# Patient Record
Sex: Female | Born: 1970 | Race: White | Hispanic: No | Marital: Married | Smoking: Never smoker
Health system: Southern US, Community
[De-identification: ages and names within clinical notes are randomized; demographics above are authoritative.]

## PROBLEM LIST (undated history)

## (undated) DIAGNOSIS — E039 Hypothyroidism, unspecified: Secondary | ICD-10-CM

## (undated) DIAGNOSIS — F909 Attention-deficit hyperactivity disorder, unspecified type: Secondary | ICD-10-CM

## (undated) DIAGNOSIS — E063 Autoimmune thyroiditis: Secondary | ICD-10-CM

## (undated) DIAGNOSIS — F32A Depression, unspecified: Secondary | ICD-10-CM

## (undated) DIAGNOSIS — D649 Anemia, unspecified: Secondary | ICD-10-CM

## (undated) DIAGNOSIS — Z9889 Other specified postprocedural states: Secondary | ICD-10-CM

## (undated) DIAGNOSIS — R112 Nausea with vomiting, unspecified: Secondary | ICD-10-CM

## (undated) DIAGNOSIS — F419 Anxiety disorder, unspecified: Secondary | ICD-10-CM

## (undated) DIAGNOSIS — T8859XA Other complications of anesthesia, initial encounter: Secondary | ICD-10-CM

## (undated) DIAGNOSIS — R519 Headache, unspecified: Secondary | ICD-10-CM

## (undated) HISTORY — PX: COLONOSCOPY: SHX174

---

## 2012-06-14 DIAGNOSIS — E039 Hypothyroidism, unspecified: Secondary | ICD-10-CM | POA: Insufficient documentation

## 2012-06-14 DIAGNOSIS — E063 Autoimmune thyroiditis: Secondary | ICD-10-CM | POA: Insufficient documentation

## 2015-07-19 DIAGNOSIS — G43909 Migraine, unspecified, not intractable, without status migrainosus: Secondary | ICD-10-CM | POA: Insufficient documentation

## 2015-09-06 DIAGNOSIS — B070 Plantar wart: Secondary | ICD-10-CM | POA: Insufficient documentation

## 2020-08-15 DIAGNOSIS — F428 Other obsessive-compulsive disorder: Secondary | ICD-10-CM | POA: Diagnosis not present

## 2020-08-20 DIAGNOSIS — F4323 Adjustment disorder with mixed anxiety and depressed mood: Secondary | ICD-10-CM | POA: Diagnosis not present

## 2020-08-20 DIAGNOSIS — F331 Major depressive disorder, recurrent, moderate: Secondary | ICD-10-CM | POA: Diagnosis not present

## 2020-08-20 DIAGNOSIS — F900 Attention-deficit hyperactivity disorder, predominantly inattentive type: Secondary | ICD-10-CM | POA: Diagnosis not present

## 2020-09-12 DIAGNOSIS — F428 Other obsessive-compulsive disorder: Secondary | ICD-10-CM | POA: Diagnosis not present

## 2020-10-10 DIAGNOSIS — F428 Other obsessive-compulsive disorder: Secondary | ICD-10-CM | POA: Diagnosis not present

## 2020-11-07 DIAGNOSIS — F428 Other obsessive-compulsive disorder: Secondary | ICD-10-CM | POA: Diagnosis not present

## 2020-11-12 DIAGNOSIS — F419 Anxiety disorder, unspecified: Secondary | ICD-10-CM | POA: Diagnosis not present

## 2020-11-12 DIAGNOSIS — F900 Attention-deficit hyperactivity disorder, predominantly inattentive type: Secondary | ICD-10-CM | POA: Diagnosis not present

## 2020-11-21 DIAGNOSIS — F428 Other obsessive-compulsive disorder: Secondary | ICD-10-CM | POA: Diagnosis not present

## 2020-11-28 DIAGNOSIS — L820 Inflamed seborrheic keratosis: Secondary | ICD-10-CM | POA: Diagnosis not present

## 2020-11-28 DIAGNOSIS — L501 Idiopathic urticaria: Secondary | ICD-10-CM | POA: Diagnosis not present

## 2020-12-05 DIAGNOSIS — F428 Other obsessive-compulsive disorder: Secondary | ICD-10-CM | POA: Diagnosis not present

## 2020-12-26 DIAGNOSIS — H5213 Myopia, bilateral: Secondary | ICD-10-CM | POA: Diagnosis not present

## 2020-12-26 DIAGNOSIS — F428 Other obsessive-compulsive disorder: Secondary | ICD-10-CM | POA: Diagnosis not present

## 2021-01-07 DIAGNOSIS — Z113 Encounter for screening for infections with a predominantly sexual mode of transmission: Secondary | ICD-10-CM | POA: Diagnosis not present

## 2021-01-16 DIAGNOSIS — F428 Other obsessive-compulsive disorder: Secondary | ICD-10-CM | POA: Diagnosis not present

## 2021-01-27 DIAGNOSIS — Z20828 Contact with and (suspected) exposure to other viral communicable diseases: Secondary | ICD-10-CM | POA: Diagnosis not present

## 2021-02-04 DIAGNOSIS — F900 Attention-deficit hyperactivity disorder, predominantly inattentive type: Secondary | ICD-10-CM | POA: Diagnosis not present

## 2021-02-04 DIAGNOSIS — F331 Major depressive disorder, recurrent, moderate: Secondary | ICD-10-CM | POA: Diagnosis not present

## 2021-02-06 DIAGNOSIS — F428 Other obsessive-compulsive disorder: Secondary | ICD-10-CM | POA: Diagnosis not present

## 2021-02-20 DIAGNOSIS — F428 Other obsessive-compulsive disorder: Secondary | ICD-10-CM | POA: Diagnosis not present

## 2021-03-06 DIAGNOSIS — F428 Other obsessive-compulsive disorder: Secondary | ICD-10-CM | POA: Diagnosis not present

## 2021-03-20 DIAGNOSIS — F428 Other obsessive-compulsive disorder: Secondary | ICD-10-CM | POA: Diagnosis not present

## 2021-04-03 DIAGNOSIS — F428 Other obsessive-compulsive disorder: Secondary | ICD-10-CM | POA: Diagnosis not present

## 2021-04-17 DIAGNOSIS — F428 Other obsessive-compulsive disorder: Secondary | ICD-10-CM | POA: Diagnosis not present

## 2021-05-02 DIAGNOSIS — F900 Attention-deficit hyperactivity disorder, predominantly inattentive type: Secondary | ICD-10-CM | POA: Diagnosis not present

## 2021-05-02 DIAGNOSIS — F411 Generalized anxiety disorder: Secondary | ICD-10-CM | POA: Diagnosis not present

## 2021-05-02 DIAGNOSIS — F4322 Adjustment disorder with anxiety: Secondary | ICD-10-CM | POA: Diagnosis not present

## 2021-05-15 DIAGNOSIS — F428 Other obsessive-compulsive disorder: Secondary | ICD-10-CM | POA: Diagnosis not present

## 2021-06-02 DIAGNOSIS — F428 Other obsessive-compulsive disorder: Secondary | ICD-10-CM | POA: Diagnosis not present

## 2021-06-17 DIAGNOSIS — E039 Hypothyroidism, unspecified: Secondary | ICD-10-CM | POA: Diagnosis not present

## 2021-06-18 DIAGNOSIS — N959 Unspecified menopausal and perimenopausal disorder: Secondary | ICD-10-CM | POA: Diagnosis not present

## 2021-06-18 DIAGNOSIS — E559 Vitamin D deficiency, unspecified: Secondary | ICD-10-CM | POA: Diagnosis not present

## 2021-06-18 DIAGNOSIS — R5383 Other fatigue: Secondary | ICD-10-CM | POA: Diagnosis not present

## 2021-06-19 DIAGNOSIS — F428 Other obsessive-compulsive disorder: Secondary | ICD-10-CM | POA: Diagnosis not present

## 2021-07-03 DIAGNOSIS — F428 Other obsessive-compulsive disorder: Secondary | ICD-10-CM | POA: Diagnosis not present

## 2021-07-18 DIAGNOSIS — F428 Other obsessive-compulsive disorder: Secondary | ICD-10-CM | POA: Diagnosis not present

## 2021-07-25 DIAGNOSIS — F331 Major depressive disorder, recurrent, moderate: Secondary | ICD-10-CM | POA: Diagnosis not present

## 2021-07-25 DIAGNOSIS — F900 Attention-deficit hyperactivity disorder, predominantly inattentive type: Secondary | ICD-10-CM | POA: Diagnosis not present

## 2021-07-25 DIAGNOSIS — F411 Generalized anxiety disorder: Secondary | ICD-10-CM | POA: Diagnosis not present

## 2021-08-08 DIAGNOSIS — F428 Other obsessive-compulsive disorder: Secondary | ICD-10-CM | POA: Diagnosis not present

## 2021-08-13 DIAGNOSIS — N959 Unspecified menopausal and perimenopausal disorder: Secondary | ICD-10-CM | POA: Diagnosis not present

## 2021-08-13 DIAGNOSIS — R41840 Attention and concentration deficit: Secondary | ICD-10-CM | POA: Diagnosis not present

## 2021-08-21 ENCOUNTER — Other Ambulatory Visit: Payer: Self-pay | Admitting: Family Medicine

## 2021-08-21 DIAGNOSIS — Z1231 Encounter for screening mammogram for malignant neoplasm of breast: Secondary | ICD-10-CM

## 2021-09-05 DIAGNOSIS — F428 Other obsessive-compulsive disorder: Secondary | ICD-10-CM | POA: Diagnosis not present

## 2021-09-11 DIAGNOSIS — D509 Iron deficiency anemia, unspecified: Secondary | ICD-10-CM | POA: Diagnosis not present

## 2021-09-11 DIAGNOSIS — Z1211 Encounter for screening for malignant neoplasm of colon: Secondary | ICD-10-CM | POA: Diagnosis not present

## 2021-09-11 DIAGNOSIS — F908 Attention-deficit hyperactivity disorder, other type: Secondary | ICD-10-CM | POA: Diagnosis not present

## 2021-09-18 ENCOUNTER — Ambulatory Visit
Admission: RE | Admit: 2021-09-18 | Discharge: 2021-09-18 | Disposition: A | Discharge status: Home / Self Care | Payer: BC Managed Care – PPO | Source: Ambulatory Visit | Attending: Family Medicine | Admitting: Family Medicine

## 2021-09-18 DIAGNOSIS — Z1231 Encounter for screening mammogram for malignant neoplasm of breast: Secondary | ICD-10-CM | POA: Diagnosis not present

## 2021-10-02 DIAGNOSIS — E039 Hypothyroidism, unspecified: Secondary | ICD-10-CM | POA: Diagnosis not present

## 2021-10-14 DIAGNOSIS — F428 Other obsessive-compulsive disorder: Secondary | ICD-10-CM | POA: Diagnosis not present

## 2021-10-22 DIAGNOSIS — F331 Major depressive disorder, recurrent, moderate: Secondary | ICD-10-CM | POA: Diagnosis not present

## 2021-10-22 DIAGNOSIS — F900 Attention-deficit hyperactivity disorder, predominantly inattentive type: Secondary | ICD-10-CM | POA: Diagnosis not present

## 2021-11-28 DIAGNOSIS — R5383 Other fatigue: Secondary | ICD-10-CM | POA: Diagnosis not present

## 2021-11-28 DIAGNOSIS — N926 Irregular menstruation, unspecified: Secondary | ICD-10-CM | POA: Diagnosis not present

## 2021-11-28 DIAGNOSIS — Z113 Encounter for screening for infections with a predominantly sexual mode of transmission: Secondary | ICD-10-CM | POA: Diagnosis not present

## 2021-11-28 DIAGNOSIS — E611 Iron deficiency: Secondary | ICD-10-CM | POA: Diagnosis not present

## 2021-12-03 DIAGNOSIS — F428 Other obsessive-compulsive disorder: Secondary | ICD-10-CM | POA: Diagnosis not present

## 2021-12-04 DIAGNOSIS — Z01411 Encounter for gynecological examination (general) (routine) with abnormal findings: Secondary | ICD-10-CM | POA: Diagnosis not present

## 2021-12-04 DIAGNOSIS — L723 Sebaceous cyst: Secondary | ICD-10-CM | POA: Diagnosis not present

## 2021-12-04 DIAGNOSIS — Z124 Encounter for screening for malignant neoplasm of cervix: Secondary | ICD-10-CM | POA: Diagnosis not present

## 2021-12-17 DIAGNOSIS — F428 Other obsessive-compulsive disorder: Secondary | ICD-10-CM | POA: Diagnosis not present

## 2022-01-07 DIAGNOSIS — F428 Other obsessive-compulsive disorder: Secondary | ICD-10-CM | POA: Diagnosis not present

## 2022-01-13 DIAGNOSIS — N939 Abnormal uterine and vaginal bleeding, unspecified: Secondary | ICD-10-CM | POA: Diagnosis not present

## 2022-01-13 DIAGNOSIS — N951 Menopausal and female climacteric states: Secondary | ICD-10-CM | POA: Diagnosis not present

## 2022-01-13 DIAGNOSIS — N83202 Unspecified ovarian cyst, left side: Secondary | ICD-10-CM | POA: Diagnosis not present

## 2022-01-14 DIAGNOSIS — F900 Attention-deficit hyperactivity disorder, predominantly inattentive type: Secondary | ICD-10-CM | POA: Diagnosis not present

## 2022-01-15 DIAGNOSIS — F428 Other obsessive-compulsive disorder: Secondary | ICD-10-CM | POA: Diagnosis not present

## 2022-01-19 DIAGNOSIS — D509 Iron deficiency anemia, unspecified: Secondary | ICD-10-CM | POA: Diagnosis not present

## 2022-01-19 DIAGNOSIS — Z1211 Encounter for screening for malignant neoplasm of colon: Secondary | ICD-10-CM | POA: Diagnosis not present

## 2022-01-21 DIAGNOSIS — F428 Other obsessive-compulsive disorder: Secondary | ICD-10-CM | POA: Diagnosis not present

## 2022-01-29 DIAGNOSIS — F428 Other obsessive-compulsive disorder: Secondary | ICD-10-CM | POA: Diagnosis not present

## 2022-02-05 DIAGNOSIS — F428 Other obsessive-compulsive disorder: Secondary | ICD-10-CM | POA: Diagnosis not present

## 2022-02-13 DIAGNOSIS — R42 Dizziness and giddiness: Secondary | ICD-10-CM

## 2022-02-13 HISTORY — DX: Dizziness and giddiness: R42

## 2022-02-20 DIAGNOSIS — F428 Other obsessive-compulsive disorder: Secondary | ICD-10-CM | POA: Diagnosis not present

## 2022-03-06 DIAGNOSIS — F428 Other obsessive-compulsive disorder: Secondary | ICD-10-CM | POA: Diagnosis not present

## 2022-03-17 ENCOUNTER — Encounter (HOSPITAL_BASED_OUTPATIENT_CLINIC_OR_DEPARTMENT_OTHER): Payer: Self-pay | Admitting: Emergency Medicine

## 2022-03-17 ENCOUNTER — Other Ambulatory Visit: Payer: Self-pay

## 2022-03-17 ENCOUNTER — Emergency Department (HOSPITAL_BASED_OUTPATIENT_CLINIC_OR_DEPARTMENT_OTHER)
Admission: EM | Admit: 2022-03-17 | Discharge: 2022-03-17 | Disposition: A | Discharge status: Home / Self Care | Payer: BC Managed Care – PPO | Attending: Emergency Medicine | Admitting: Emergency Medicine

## 2022-03-17 DIAGNOSIS — R112 Nausea with vomiting, unspecified: Secondary | ICD-10-CM | POA: Insufficient documentation

## 2022-03-17 DIAGNOSIS — R42 Dizziness and giddiness: Secondary | ICD-10-CM | POA: Diagnosis not present

## 2022-03-17 DIAGNOSIS — R519 Headache, unspecified: Secondary | ICD-10-CM | POA: Diagnosis not present

## 2022-03-17 HISTORY — DX: Autoimmune thyroiditis: E06.3

## 2022-03-17 HISTORY — DX: Attention-deficit hyperactivity disorder, unspecified type: F90.9

## 2022-03-17 LAB — BASIC METABOLIC PANEL
Anion gap: 7 (ref 5–15)
BUN: 17 mg/dL (ref 6–20)
CO2: 27 mmol/L (ref 22–32)
Calcium: 9.4 mg/dL (ref 8.9–10.3)
Chloride: 103 mmol/L (ref 98–111)
Creatinine, Ser: 0.8 mg/dL (ref 0.44–1.00)
GFR, Estimated: >60 mL/min (ref >60)
Glucose, Bld: 100 mg/dL — ABNORMAL HIGH (ref 70–99)
Potassium: 3.9 mmol/L (ref 3.5–5.1)
Sodium: 137 mmol/L (ref 135–145)

## 2022-03-17 LAB — CBC WITH DIFFERENTIAL/PLATELET
Abs Immature Granulocytes: 0.01 10*3/uL (ref 0.00–0.07)
Basophils Absolute: 0 10*3/uL (ref 0.0–0.1)
Basophils Relative: 0 %
Eosinophils Absolute: 0 10*3/uL (ref 0.0–0.5)
Eosinophils Relative: 0 %
HCT: 40.7 % (ref 36.0–46.0)
Hemoglobin: 13.7 g/dL (ref 12.0–15.0)
Immature Granulocytes: 0 %
Lymphocytes Relative: 16 %
Lymphs Abs: 1.1 10*3/uL (ref 0.7–4.0)
MCH: 29.7 pg (ref 26.0–34.0)
MCHC: 33.7 g/dL (ref 30.0–36.0)
MCV: 88.3 fL (ref 80.0–100.0)
Monocytes Absolute: 0.3 10*3/uL (ref 0.1–1.0)
Monocytes Relative: 5 %
Neutro Abs: 5.6 10*3/uL (ref 1.7–7.7)
Neutrophils Relative %: 79 %
Platelets: 199 10*3/uL (ref 150–400)
RBC: 4.61 MIL/uL (ref 3.87–5.11)
RDW: 12.5 % (ref 11.5–15.5)
WBC: 7.1 10*3/uL (ref 4.0–10.5)
nRBC: 0 % (ref 0.0–0.2)

## 2022-03-17 MED ORDER — MECLIZINE HCL 25 MG PO TABS: 25.0000 mg | ORAL_TABLET | Freq: Three times a day (TID) | ORAL | 0 refills | Status: DC | PRN

## 2022-03-17 MED ORDER — MECLIZINE HCL 25 MG PO TABS
25.0000 mg | ORAL_TABLET | Freq: Once | ORAL | Status: AC
  Administered 2022-03-17: 25 mg via ORAL
  Filled 2022-03-17: qty 1

## 2022-03-17 MED ORDER — ONDANSETRON HCL 4 MG PO TABS: 4.0000 mg | ORAL_TABLET | Freq: Four times a day (QID) | ORAL | 0 refills | Status: DC

## 2022-03-17 MED ORDER — ONDANSETRON HCL 4 MG/2ML IJ SOLN
4.0000 mg | Freq: Once | INTRAMUSCULAR | Status: AC
  Administered 2022-03-17: 4 mg via INTRAVENOUS
  Filled 2022-03-17: qty 2

## 2022-03-17 MED ORDER — SODIUM CHLORIDE 0.9 % IV BOLUS
1000.0000 mL | Freq: Once | INTRAVENOUS | Status: AC
  Administered 2022-03-17: 1000 mL via INTRAVENOUS

## 2022-03-17 NOTE — ED Provider Notes (Signed)
[facility] EMERGENCY DEPT Provider Note   CSN: [id number] Arrival date & time: 03/17/22  1805     History  No chief complaint on file.   Laura Spencer is a 51 y.o. female.  Patient is a 51 year old female who presents with vertigo.  She said she woke up this morning with a spinning sensation and dizziness.  She has associated nausea and vomiting.  She has a very mild headache.  No neck pain.  No fevers.  No numbness or weakness to her extremities.  No vision changes or blind spots.  No speech deficits.  No history of similar symptoms in the past.  She went to [facility] health center at [facility] [employer] [facility] [employer] where she goes to school.  She was sent here for further evaluation.  She is feeling a little bit better than she did earlier today.       Home Medications Prior to Admission medications   Medication Sig Start Date End Date Taking? Authorizing Provider  meclizine (ANTIVERT) 25 MG tablet Take 1 tablet (25 mg total) by mouth 3 (three) times daily as needed for dizziness. 03/17/22  Yes Malvin Johns, MD  ondansetron (ZOFRAN) 4 MG tablet Take 1 tablet (4 mg total) by mouth every 6 (six) hours. 03/17/22  Yes Malvin Johns, MD      Allergies    Patient has no allergy information on record.    Review of Systems   Review of Systems  Constitutional:  Negative for chills, diaphoresis, fatigue and fever.  HENT:  Negative for congestion, rhinorrhea and sneezing.   Eyes: Negative.   Respiratory:  Negative for cough, chest tightness and shortness of breath.   Cardiovascular:  Negative for chest pain and leg swelling.  Gastrointestinal:  Positive for nausea and vomiting. Negative for abdominal pain, blood in stool and diarrhea.  Genitourinary:  Negative for difficulty urinating, flank pain, frequency and hematuria.  Musculoskeletal:  Negative for arthralgias and back pain.  Skin:  Negative for rash.  Neurological:  Positive for dizziness and headaches. Negative for speech difficulty,  weakness and numbness.    Physical Exam Updated Vital Signs BP 112/70   Pulse 60   Temp 98 F (36.7 C) (Oral)   Resp 17   Ht '5\' 6"'$  (1.676 m)   Wt 74.8 kg   SpO2 100%   BMI 26.63 kg/m  Physical Exam Constitutional:      Appearance: She is well-developed.  HENT:     Head: Normocephalic and atraumatic.  Eyes:     Pupils: Pupils are equal, round, and reactive to light.  Cardiovascular:     Rate and Rhythm: Normal rate and regular rhythm.     Heart sounds: Normal heart sounds.  Pulmonary:     Effort: Pulmonary effort is normal. No respiratory distress.     Breath sounds: Normal breath sounds. No wheezing or rales.  Chest:     Chest wall: No tenderness.  Abdominal:     General: Bowel sounds are normal.     Palpations: Abdomen is soft.     Tenderness: There is no abdominal tenderness. There is no guarding or rebound.  Musculoskeletal:        General: Normal range of motion.     Cervical back: Normal range of motion and neck supple.  Lymphadenopathy:     Cervical: No cervical adenopathy.  Skin:    General: Skin is warm and dry.     Findings: No rash.  Neurological:     Mental Status: She is alert  and oriented to person, place, and time.     Comments: Motor 5/5 all extremities Sensation grossly intact to LT all extremities Finger to Nose intact, no pronator drift CN II-XII grossly intact       ED Results / Procedures / Treatments   Labs (all labs ordered are listed, but only abnormal results are displayed) Labs Reviewed  BASIC METABOLIC PANEL - Abnormal; Notable for the following components:      Result Value   Glucose, Bld 100 (*)    All other components within normal limits  CBC WITH DIFFERENTIAL/PLATELET    EKG None  Radiology No results found.  Procedures Procedures    Medications Ordered in ED Medications  sodium chloride 0.9 % bolus 1,000 mL (0 mLs Intravenous Stopped 03/17/22 2235)  ondansetron (ZOFRAN) injection 4 mg (4 mg Intravenous Given  03/17/22 2146)  meclizine (ANTIVERT) tablet 25 mg (25 mg Oral Given 03/17/22 2146)    ED Course/ Medical Decision Making/ A&P                           Medical Decision Making Problems Addressed: Vertigo: acute illness or injury with systemic symptoms  Amount and/or Complexity of Data Reviewed Labs: ordered. Decision-making details documented in ED Course.  Risk Prescription drug management. Decision regarding hospitalization.   Patient is a 51 year old who presents with dizziness.  She does not have any focal neurologic deficits.  Her hints exam is consistent with peripheral etiology for the vertigo.  No other symptoms that are more concerning for central etiology/stroke.  Her labs are reviewed and are nonconcerning.  She was given IV fluids and meclizine as well as Zofran.  She is feeling much better.  Her dizziness is much improved.  She is able to ambulate without ataxia.  She was discharged home in good condition.  She was encouraged to follow-up with her primary care doctor.  Return precautions were given.  She was given prescriptions for meclizine and Zofran.  Final Clinical Impression(s) / ED Diagnoses Final diagnoses:  Vertigo    Rx / DC Orders ED Discharge Orders          Ordered    meclizine (ANTIVERT) 25 MG tablet  3 times daily PRN        03/17/22 2308    ondansetron (ZOFRAN) 4 MG tablet  Every 6 hours        03/17/22 2308              Malvin Johns, MD 03/17/22 2311

## 2022-03-17 NOTE — ED Notes (Signed)
Patient reports feeling much better after treatment. States "its like night and day from earlier" Able to ambulate in the room without symptoms

## 2022-03-17 NOTE — ED Notes (Signed)
Reviewed AVS/discharge instruction with patient. Time allotted for and all questions answered. Patient is agreeable for d/c and escorted to ed exit by staff.  

## 2022-03-17 NOTE — ED Triage Notes (Signed)
Pt arrived POV. Pt caox4. Pt reports she woke up this morning "with vertigo." Pt c/o dizziness and N/V. Pt reports multiple episodes of vomiting this morning. Pt denies pain. Pt took Zofran approx 530 this evening.

## 2022-03-20 DIAGNOSIS — F428 Other obsessive-compulsive disorder: Secondary | ICD-10-CM | POA: Diagnosis not present

## 2022-03-28 DIAGNOSIS — U071 COVID-19: Secondary | ICD-10-CM | POA: Diagnosis not present

## 2022-04-03 DIAGNOSIS — F428 Other obsessive-compulsive disorder: Secondary | ICD-10-CM | POA: Diagnosis not present

## 2022-04-03 DIAGNOSIS — F4322 Adjustment disorder with anxiety: Secondary | ICD-10-CM | POA: Diagnosis not present

## 2022-04-10 DIAGNOSIS — F900 Attention-deficit hyperactivity disorder, predominantly inattentive type: Secondary | ICD-10-CM | POA: Diagnosis not present

## 2022-04-10 DIAGNOSIS — F411 Generalized anxiety disorder: Secondary | ICD-10-CM | POA: Diagnosis not present

## 2022-04-10 DIAGNOSIS — F331 Major depressive disorder, recurrent, moderate: Secondary | ICD-10-CM | POA: Diagnosis not present

## 2022-04-10 DIAGNOSIS — F4323 Adjustment disorder with mixed anxiety and depressed mood: Secondary | ICD-10-CM | POA: Diagnosis not present

## 2022-04-16 DIAGNOSIS — N858 Other specified noninflammatory disorders of uterus: Secondary | ICD-10-CM | POA: Diagnosis not present

## 2022-04-16 DIAGNOSIS — Z6828 Body mass index (BMI) 28.0-28.9, adult: Secondary | ICD-10-CM | POA: Diagnosis not present

## 2022-04-17 DIAGNOSIS — F413 Other mixed anxiety disorders: Secondary | ICD-10-CM | POA: Diagnosis not present

## 2022-04-24 ENCOUNTER — Telehealth: Payer: Self-pay

## 2022-04-24 NOTE — Telephone Encounter (Signed)
Left message for patient to call back and schedule new patient appointment on 11/13 '@9'$ :45am with Dr Ernestina Patches.

## 2022-04-24 NOTE — Telephone Encounter (Signed)
Spoke with the patient regarding the referral to GYN oncology. Patient scheduled as new patient with Dr Ernestina Patches on 04/27/2022. Patient given an arrival time of 9:15am.  Explained to the patient the the doctor will perform a pelvic exam at this visit. Patient given the policy that no visitors under the 16 yrs are allowed in the [facility]. Patient given the address/phone number for the clinic and that the center offers free valet service.

## 2022-04-26 NOTE — Progress Notes (Unsigned)
GYNECOLOGIC ONCOLOGY NEW PATIENT CONSULTATION  Date of Service: 04/27/2022 Referring Provider: Irene Pap, MD   ASSESSMENT AND PLAN: Laura Spencer is a 51 y.o. 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  We reviewed that the exact etiology of the pelvic mass is unclear, but could include a benign, borderline, or malignant process.  The recommended treatment is surgical excision to make a definitive diagnosis. She has normal tumor markers which is reassuring but not definitive. We also discussed that her irregular bleeding is likely normal perimenopausal changes. 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 See procedure note below.  Because the mass is relatively small, we feel that a minimally invasive approach is feasible, using robotic assistance. We discussed a salpingectomy, possible unilateral salpingo-oophorectomy. Discussed that often if a complex mass in the adnexa, would remove both together as not to disrupt the mass and remove intact.   In the event of malignancy or borderline tumor on frozen section, we will perform indicated staging procedures. We discussed that these procedures may include omentectomy pelvic and/or para-aortic lymphadenectomy, peritoneal biopsies. We would also remove any tissue concerning for metastatic disease which could require additional procedures including bowel surgery. Patient wishes to forgo a hysterectomy if benign disease. If malignant or borderline, she is in agreement with recommendation for hysterectomy. Otherwise, if biopsy abnormal, will discuss possible hysterectomy at time of adnexal surgery if indicated.   Patient was consented for: Robotic assisted unilateral salpingectomy versus unilateral  salpingo-oophorectomy, possible bilateral salpingo-oophorectomy, hysterectomy, staging procedures (omentectomy, pelvic/para-aortic lymph node dissection, biopsies) on 06/23/22.  Patient wishes to wait until January given some upcoming personal plans. Discussed CT abdomen/pelvis prior to surgery and if any additional concerns for malignancy based on imaging, we discussed that I may recommend surgery sooner.  The risks of surgery were discussed in detail and she understands these to including but not limited to bleeding requiring a blood transfusion, infection, injury to adjacent organs (including but not limited to the bowels, bladder, ureters, nerves, blood vessels), thromboembolic events, wound separation, hernia, vaginal cuff separation, possible risk of lymphedema and lymphocyst if lymphadenectomy performed, unforseen complication, and possible need for re-exploration.  If the patient experiences any of these events, she understands that her hospitalization or recovery may be prolonged and that she may need to take additional medications for a prolonged period. The patient will receive DVT and antibiotic prophylaxis as indicated. She voiced a clear understanding. She had the opportunity to ask questions and written informed consent was obtained today. She wishes to proceed.  She does not require preoperative clearance. Her METs are >4.  All preoperative instructions were reviewed. Postoperative expectations were also reviewed.   A copy of this note was sent to the patient's referring provider.  Bernadene Bell, MD Gynecologic Oncology   Medical Decision Making I personally spent  TOTAL 53 minutes face-to-face and non-face-to-face in the care of this patient, which includes all pre, intra, and post visit time on the date of service.  5 minutes spent reviewing records prior to the visit 35 Minutes in patient contact      5 minutes in other billable services 8 minutes charting , conferring with  consultants etc.   ------------  CC: adnexal mass  HISTORY OF PRESENT ILLNESS:  Laura Spencer is a 51 y.o. woman who is seen in consultation at  the request of Irene Pap, MD for evaluation of complex pelvic mass.  Patient previously underwent an ultrasound on 01/13/2022 for abnormal uterine bleeding and IUD with strings not visualized.  This demonstrated an IUD in place, endometrial stripe of 5.4 mm and 2 cyst with irregular borders of the left adnexa measuring 2.9 cm and 2.3 cm.  Patient then underwent a repeat pelvic ultrasound on 04/16/2022 which demonstrated an endometrial stripe of 8.2 mm, IUD in place at the uterine fundus, and irregular cystic areas in the left adnexa which appears to be fluid-filled left fallopian tube.  She underwent tumor markers which were within normal limits with a CEA of 0.6, CA 19-9 of 13, and a CA125 of 17.5.  At the time of this visit, patient reported resolution of her irregular bleeding with management of her thyroid.  She noted a regular period on 03/19/2022.  She has a copper IUD in place.  She had reported irregular menses x1 year.  Today patient presents with her fianc.  She reports heavy periods last year and lighter but irregular periods over the course of this year.  She believes her IUD is a copper IUD, in place since March 2017.  She reports that she has not had an endometrial biopsy before.  Her bleeding is currently spotting to regular volume and sporadic; however, her bleeding this past months she felt was more on time with a regular period.  She otherwise denies abdominal bloating, early satiety, significant weight loss, change in bowel or bladder habits.   PAST MEDICAL HISTORY: Past Medical History:  Diagnosis Date   Attention deficit hyperactivity disorder    Hashimoto's disease     PAST SURGICAL HISTORY: Past Surgical History:  Procedure Laterality Date   CESAREAN SECTION     x2    OB/GYN HISTORY: OB History  Gravida Para  Term Preterm AB Living  '2 2 2     2  '$ SAB IAB Ectopic Multiple Live Births          2    # Outcome Date GA Lbr Len/2nd Weight Sex Delivery Anes PTL Lv  2 Term      CS-Unspec   LIV  1 Term      CS-Unspec   LIV      Age at menarche: 76 Age at menopause: N/A Hx of HRT: N/A Hx of STI: No Last pap: 12/04/2021 NILM, HPV negative History of abnormal pap smears: No  SCREENING STUDIES:  Last mammogram: 11/2021 Last colonoscopy: 01/19/2022, 10-year follow-up  MEDICATIONS:  Current Outpatient Medications:    methylphenidate 27 MG PO CR tablet, Take 27 mg by mouth daily., Disp: , Rfl:    ondansetron (ZOFRAN-ODT) 4 MG disintegrating tablet, Take 4 mg by mouth every 8 (eight) hours as needed., Disp: , Rfl:    meclizine (ANTIVERT) 25 MG tablet, Take 1 tablet (25 mg total) by mouth 3 (three) times daily as needed for dizziness., Disp: 30 tablet, Rfl: 0   Multiple Vitamin (MULTI-VITAMIN) tablet, Take by mouth., Disp: , Rfl:    SYNTHROID 125 MCG tablet, SMARTSIG:1 Tablet(s) By Mouth 6 Times a Week, Disp: , Rfl:   ALLERGIES: Not on File  FAMILY HISTORY: Family History  Problem Relation Age of Onset   Breast cancer Neg Hx    Ovarian cancer Neg Hx    Colon cancer Neg Hx    Endometrial cancer Neg Hx     SOCIAL HISTORY: Social History   Socioeconomic History   Marital status: Married  Spouse name: Not on file   Number of children: Not on file   Years of education: Not on file   Highest education level: Not on file  Occupational History   Not on file  Tobacco Use   Smoking status: Never   Smokeless tobacco: Never  Substance and Sexual Activity   Alcohol use: Not Currently    Alcohol/week: 1.0 standard drink of alcohol    Types: 1 Standard drinks or equivalent per week   Drug use: Never   Sexual activity: Yes    Partners: Male  Other Topics Concern   Not on file  Social History Narrative   Not on file   Social Determinants of Health   Financial Resource Strain: Not on file   Food Insecurity: Not on file  Transportation Needs: Not on file  Physical Activity: Not on file  Stress: Not on file  Social Connections: Not on file  Intimate Partner Violence: Not on file    REVIEW OF SYSTEMS: New patient intake form was reviewed.  Complete 10-system review is negative except for the following: Dizziness, chronic left ankle swelling  PHYSICAL EXAM: BP 114/68 (BP Location: Right Arm, Patient Position: Sitting)   Pulse 63   Temp 97.9 F (36.6 C) (Oral)   Resp 14   Ht '5\' 6"'$  (1.676 m)   Wt 172 lb (78 kg)   SpO2 100%   BMI 27.76 kg/m  Constitutional: No acute distress. Neuro/Psych: Alert, oriented.  Head and Neck: Normocephalic, atraumatic. Neck symmetric without masses. Sclera anicteric.  Respiratory: Normal work of breathing. Clear to auscultation bilaterally. Cardiovascular: Regular rate and rhythm, no murmurs, rubs, or gallops. Abdomen:Normoactive bowel sounds. Soft, non-distended, non-tender to palpation. No masses or hepatosplenomegaly appreciated. No evidence of hernia. No palpable fluid wave.  Well-healed Pfannenstiel incision. Extremities: Grossly normal range of motion. Warm, well perfused. No edema bilaterally. Skin: No rashes or lesions. Lymphatic: No cervical, supraclavicular, or inguinal adenopathy. Genitourinary: External genitalia without lesions. Urethral meatus without lesions or prolapse. On speculum exam, vagina and cervix without lesions.  IUD strings visualized. Bimanual exam reveals normal cervix and small anteverted uterus, mobile.  Mild fullness of the left adnexa but mobile.Exam chaperoned by Joylene John, NP  EMB Procedure: After appropriate verbal informed consent was obtained, a timeout was performed. A sterile speculum was placed in the vagina, and the area was cleaned with betadine x3. A single-tooth tenaculum was placed on the anterior lip of the cervix. The os finder was used to dilate the cervix. An endometrial biopsy pipelle was  advanced carefully to the uterine fundus which sounded to 8cm. An adequate sample was obtained over 2 passes. The tenaculum was removed, and tenaculum sites were noted to be hemostatic. The speculum was removed from the vagina. The patient tolerated the procedure well.   UPT: not indicated (age, IUD)   LABORATORY AND RADIOLOGIC DATA: Outside medical records were reviewed to synthesize the above history, along with the history and physical obtained during the visit.  Outside laboratory, and imaging reports were reviewed, with pertinent results below.  I personally reviewed the outside images.  WBC  Date Value Ref Range Status  03/17/2022 7.1 4.0 - 10.5 K/uL Final   Hemoglobin  Date Value Ref Range Status  03/17/2022 13.7 12.0 - 15.0 g/dL Final   HCT  Date Value Ref Range Status  03/17/2022 40.7 36.0 - 46.0 % Final   Platelets  Date Value Ref Range Status  03/17/2022 199 150 - 400 K/uL Final  Creatinine, Ser  Date Value Ref Range Status  03/17/2022 0.80 0.44 - 1.00 mg/dL Final   Pelvic ultrasound 04/16/2022: Anteverted uterus Endo equals 8.2 mm Right and left ovaries visualized within normal limits IUD seen in place at the top of the uterus Irregular cystic areas in the left adnexa appears to be fluid-filled left fallopian tube rather than left ovary which was better visualized on today's ultrasound  Tumor markers: CEA: 0.6 CA 19-9: 13 CA125: 17.5

## 2022-04-27 ENCOUNTER — Encounter: Payer: Self-pay | Admitting: Psychiatry

## 2022-04-27 ENCOUNTER — Inpatient Hospital Stay: Payer: BC Managed Care – PPO | Attending: Psychiatry | Admitting: Psychiatry

## 2022-04-27 VITALS — BP 114/68 | HR 63 | Temp 97.9°F | Resp 14 | Ht 66.0 in | Wt 172.0 lb

## 2022-04-27 DIAGNOSIS — Z79899 Other long term (current) drug therapy: Secondary | ICD-10-CM | POA: Diagnosis not present

## 2022-04-27 DIAGNOSIS — N939 Abnormal uterine and vaginal bleeding, unspecified: Secondary | ICD-10-CM | POA: Insufficient documentation

## 2022-04-27 DIAGNOSIS — Z975 Presence of (intrauterine) contraceptive device: Secondary | ICD-10-CM | POA: Diagnosis not present

## 2022-04-27 DIAGNOSIS — E063 Autoimmune thyroiditis: Secondary | ICD-10-CM | POA: Diagnosis not present

## 2022-04-27 DIAGNOSIS — F909 Attention-deficit hyperactivity disorder, unspecified type: Secondary | ICD-10-CM | POA: Insufficient documentation

## 2022-04-27 DIAGNOSIS — D3912 Neoplasm of uncertain behavior of left ovary: Secondary | ICD-10-CM | POA: Insufficient documentation

## 2022-04-27 DIAGNOSIS — R1900 Intra-abdominal and pelvic swelling, mass and lump, unspecified site: Secondary | ICD-10-CM

## 2022-04-27 DIAGNOSIS — N711 Chronic inflammatory disease of uterus: Secondary | ICD-10-CM | POA: Diagnosis not present

## 2022-04-27 NOTE — Patient Instructions (Addendum)
Plan to have a CT scan and Dr. Ernestina Patches will notify you with the results.  We will also contact you with the results of your endometrial biopsy from today. You may expereince spotting after this. Please call if you are having heavy bleeding.  We will plan to see you in the office early Jan 2024 for a preop appointment with Joylene John NP and you will also receive a phone call from the hospital to arrange for a preop appt there. These appointments can usually be combined on the same day.  We will hold surgery time on June 23, 2022 at [facility].

## 2022-04-28 ENCOUNTER — Other Ambulatory Visit: Payer: Self-pay | Admitting: Gynecologic Oncology

## 2022-04-28 DIAGNOSIS — N939 Abnormal uterine and vaginal bleeding, unspecified: Secondary | ICD-10-CM

## 2022-04-28 DIAGNOSIS — R1900 Intra-abdominal and pelvic swelling, mass and lump, unspecified site: Secondary | ICD-10-CM

## 2022-04-29 LAB — SURGICAL PATHOLOGY

## 2022-05-04 ENCOUNTER — Ambulatory Visit (HOSPITAL_COMMUNITY)
Admission: RE | Admit: 2022-05-04 | Discharge: 2022-05-04 | Disposition: A | Discharge status: Home / Self Care | Payer: BC Managed Care – PPO | Source: Ambulatory Visit | Attending: Psychiatry | Admitting: Psychiatry

## 2022-05-04 DIAGNOSIS — K429 Umbilical hernia without obstruction or gangrene: Secondary | ICD-10-CM | POA: Diagnosis not present

## 2022-05-04 DIAGNOSIS — R1900 Intra-abdominal and pelvic swelling, mass and lump, unspecified site: Secondary | ICD-10-CM | POA: Diagnosis not present

## 2022-05-04 DIAGNOSIS — N7011 Chronic salpingitis: Secondary | ICD-10-CM | POA: Diagnosis not present

## 2022-05-04 MED ORDER — SODIUM CHLORIDE (PF) 0.9 % IJ SOLN
INTRAMUSCULAR | Status: AC
  Filled 2022-05-04: qty 50

## 2022-05-04 MED ORDER — IOHEXOL 300 MG/ML  SOLN
100.0000 mL | Freq: Once | INTRAMUSCULAR | Status: AC | PRN
  Administered 2022-05-04: 100 mL via INTRAVENOUS

## 2022-05-05 ENCOUNTER — Telehealth: Payer: Self-pay | Admitting: Psychiatry

## 2022-05-05 NOTE — Telephone Encounter (Signed)
Called Laura Spencer to review her pathology/CT results. No answer. Left generic VM. Will try again at a later time.

## 2022-05-06 ENCOUNTER — Telehealth: Payer: Self-pay | Admitting: Psychiatry

## 2022-05-06 DIAGNOSIS — N711 Chronic inflammatory disease of uterus: Secondary | ICD-10-CM

## 2022-05-06 MED ORDER — DOXYCYCLINE HYCLATE 100 MG PO TABS: 100.0000 mg | ORAL_TABLET | Freq: Two times a day (BID) | ORAL | 0 refills | Status: AC

## 2022-05-06 NOTE — Telephone Encounter (Signed)
CT and biopsy results reviewed. Rx for doxycyline '100mg'$  BID x10d sent to pharmacy.

## 2022-05-12 ENCOUNTER — Encounter: Payer: Self-pay | Admitting: Psychiatry

## 2022-05-15 DIAGNOSIS — F4322 Adjustment disorder with anxiety: Secondary | ICD-10-CM | POA: Diagnosis not present

## 2022-06-16 NOTE — Patient Instructions (Addendum)
SURGICAL WAITING ROOM VISITATION  Patients having surgery or a procedure may have no more than 2 support people in the waiting area - these visitors may rotate.    Children under the age of 29 must have an adult with them who is not the patient.  Due to an increase in RSV and influenza rates and associated hospitalizations, children ages 27 and under may not visit patients in [place].  If the patient needs to stay at the hospital during part of their recovery, the visitor guidelines for inpatient rooms apply. Pre-op nurse will coordinate an appropriate time for 1 support person to accompany patient in pre-op.  This support person may not rotate.    Please refer to the [facility] website for the visitor guidelines for Inpatients (after your surgery is over and you are in a regular room).       Your procedure is scheduled on: 06-23-22   Report to [facility] Main Entrance    Report to admitting at      [place]  AM   Call this number if you have problems the morning of surgery [phone]  Eat a light diet the day before surgery.  Examples including soups, broths, toast, yogurt, mashed potatoes.  Things to avoid include carbonated beverages (fizzy beverages), raw fruits and raw vegetables, or beans.   If your bowels are filled with gas, your surgeon will have difficulty visualizing your pelvic organs which increases your surgical risks.   Do not eat food :After Midnight.   After Midnight you may have the following liquids until __0430____ AM DAY OF SURGERY  then nothing by mouth  Water Non-Citrus Juices (without pulp, NO RED-Apple, White grape, White cranberry) Black Coffee (NO MILK/CREAM OR CREAMERS, sugar ok)  Clear Tea (NO MILK/CREAM OR CREAMERS, sugar ok) regular and decaf                             Plain Jell-O (NO RED)                                           Fruit ices (not with fruit pulp, NO RED)                                     Popsicles (NO RED)                                                                Sports drinks like Gatorade (NO RED)                            If you have questions, please contact your surgeon's office.   FOLLOW  ANY ADDITIONAL PRE OP INSTRUCTIONS YOU RECEIVED FROM YOUR SURGEON'S OFFICE!!!     Oral Hygiene is also important to reduce your risk of infection.  Remember - BRUSH YOUR TEETH THE MORNING OF SURGERY WITH YOUR REGULAR TOOTHPASTE  DENTURES WILL BE REMOVED PRIOR TO SURGERY PLEASE DO NOT APPLY "Poly grip" OR ADHESIVES!!!   Do NOT smoke after Midnight   Take these medicines the morning of surgery with A SIP OF WATER: synthroid, zyrtec  DO NOT TAKE ANY ORAL DIABETIC MEDICATIONS DAY OF YOUR SURGERY  Bring CPAP mask and tubing day of surgery.                              You may not have any metal on your body including hair pins, jewelry, and body piercing             Do not wear make-up, lotions, powders, perfumes/cologne, or deodorant  Do not wear nail polish including gel and S&S, artificial/acrylic nails, or any other type of covering on natural nails including finger and toenails. If you have artificial nails, gel coating, etc. that needs to be removed by a nail salon please have this removed prior to surgery or surgery may need to be canceled/ delayed if the surgeon/ anesthesia feels like they are unable to be safely monitored.   Do not shave  48 hours prior to surgery.                 Do not bring valuables to the hospital. [place].   Contacts, glasses, dentures or bridgework may not be worn into surgery.   Bring small overnight bag day of surgery.   DO NOT [place]. PHARMACY WILL DISPENSE MEDICATIONS LISTED ON YOUR MEDICATION LIST TO YOU DURING YOUR ADMISSION [place]!    Patients discharged on the day of surgery will not be allowed to drive home.  Someone  NEEDS to stay with you for the first 24 hours after anesthesia.                 Please read over the following fact sheets you were given: IF [place] [phone]   If you received a COVID test during your pre-op visit  it is requested that you wear a mask when out in public, stay away from anyone that may not be feeling well and notify your surgeon if you develop symptoms. If you test positive for Covid or have been in contact with anyone that has tested positive in the last 10 days please notify you surgeon.    [place] - Preparing for Surgery Before surgery, you can play an important role.  Because skin is not sterile, your skin needs to be as free of germs as possible.  You can reduce the number of germs on your skin by washing with CHG (chlorahexidine gluconate) soap before surgery.  CHG is an antiseptic cleaner which kills germs and bonds with the skin to continue killing germs even after washing. Please DO NOT use if you have an allergy to CHG or antibacterial soaps.  If your skin becomes reddened/irritated stop using the CHG and inform your nurse when you arrive at Short Stay. Do not shave (including legs and underarms) for at least 48 hours prior to the first CHG shower.  You may shave your face/neck. Please follow these instructions carefully:  1.  Shower with CHG Soap the night before  surgery and the  morning of Surgery.  2.  If you choose to wash your hair, wash your hair first as usual with your  normal  shampoo.  3.  After you shampoo, rinse your hair and body thoroughly to remove the  shampoo.                           4.  Use CHG as you would any other liquid soap.  You can apply chg directly  to the skin and wash                       Gently with a scrungie or clean washcloth.  5.  Apply the CHG Soap to your body ONLY FROM THE NECK DOWN.   Do not use on face/ open                           Wound or open sores. Avoid contact  with eyes, ears mouth and genitals (private parts).                       Wash face,  Genitals (private parts) with your normal soap.             6.  Wash thoroughly, paying special attention to the area where your surgery  will be performed.  7.  Thoroughly rinse your body with warm water from the neck down.  8.  DO NOT shower/wash with your normal soap after using and rinsing off  the CHG Soap.                9.  Pat yourself dry with a clean towel.            10.  Wear clean pajamas.            11.  Place clean sheets on your bed the night of your first shower and do not  sleep with pets. Day of Surgery : Do not apply any lotions/deodorants the morning of surgery.  Please wear clean clothes to the hospital/surgery center.  FAILURE TO FOLLOW THESE INSTRUCTIONS MAY RESULT IN THE CANCELLATION OF YOUR SURGERY PATIENT SIGNATURE_________________________________  NURSE SIGNATURE__________________________________  ________________________________________________________________________ WHAT IS A BLOOD TRANSFUSION? Blood Transfusion Information  A transfusion is the replacement of blood or some of its parts. Blood is made up of multiple cells which provide different functions. Red blood cells carry oxygen and are used for blood loss replacement. White blood cells fight against infection. Platelets control bleeding. Plasma helps clot blood. Other blood products are available for specialized needs, such as hemophilia or other clotting disorders. BEFORE THE TRANSFUSION  Who gives blood for transfusions?  Healthy volunteers who are fully evaluated to make sure their blood is safe. This is blood bank blood. Transfusion therapy is the safest it has ever been in the practice of medicine. Before blood is taken from a donor, a complete history is taken to make sure that person has no history of diseases nor engages in risky social behavior (examples are intravenous drug use or sexual activity with  multiple partners). The donor's travel history is screened to minimize risk of transmitting infections, such as malaria. The donated blood is tested for signs of infectious diseases, such as HIV and hepatitis. The blood is then tested to be sure it is compatible with you in order to minimize the chance  of a transfusion reaction. If you or a relative donates blood, this is often done in anticipation of surgery and is not appropriate for emergency situations. It takes many days to process the donated blood. RISKS AND COMPLICATIONS Although transfusion therapy is very safe and saves many lives, the main dangers of transfusion include:  Getting an infectious disease. Developing a transfusion reaction. This is an allergic reaction to something in the blood you were given. Every precaution is taken to prevent this. The decision to have a blood transfusion has been considered carefully by your caregiver before blood is given. Blood is not given unless the benefits outweigh the risks. AFTER THE TRANSFUSION Right after receiving a blood transfusion, you will usually feel much better and more energetic. This is especially true if your red blood cells have gotten low (anemic). The transfusion raises the level of the red blood cells which carry oxygen, and this usually causes an energy increase. The nurse administering the transfusion will monitor you carefully for complications. HOME CARE INSTRUCTIONS  No special instructions are needed after a transfusion. You may find your energy is better. Speak with your caregiver about any limitations on activity for underlying diseases you may have. SEEK MEDICAL CARE IF:  Your condition is not improving after your transfusion. You develop redness or irritation at the intravenous (IV) site. SEEK IMMEDIATE MEDICAL CARE IF:  Any of the following symptoms occur over the next 12 hours: Shaking chills. You have a temperature by mouth above 102 F (38.9 C), not controlled by  medicine. Chest, back, or muscle pain. People around you feel you are not acting correctly or are confused. Shortness of breath or difficulty breathing. Dizziness and fainting. You get a rash or develop hives. You have a decrease in urine output. Your urine turns a dark color or changes to pink, red, or brown. Any of the following symptoms occur over the next 10 days: You have a temperature by mouth above 102 F (38.9 C), not controlled by medicine. Shortness of breath. Weakness after normal activity. The white part of the eye turns yellow (jaundice). You have a decrease in the amount of urine or are urinating less often. Your urine turns a dark color or changes to pink, red, or brown. Document Released: 05/29/2000 Document Revised: 08/24/2011 Document Reviewed: 01/16/2008 [facility] Patient Information 2014 [place], [place].  _______________________________________________________________________

## 2022-06-16 NOTE — Progress Notes (Addendum)
PCP - Dr. Naida Sleight but pt.  Just graduated so does not currently have PCP  Cardiologist - no  PPM/ICD -  Device Orders -  Rep Notified -   Chest x-ray -  EKG -  Stress Test -  ECHO -  Cardiac Cath -   Sleep Study -  CPAP -   Fasting Blood Sugar -  Checks Blood Sugar _____ times a day  Blood Thinner Instructions: Aspirin Instructions:  ERAS Protcol - PRE-SURGERY Ensure or G2-    COVID vaccine -x5  Activity-- Able to climb a flight of stairs without CP or SOB Anesthesia review:   Patient denies shortness of breath, fever, cough and chest pain at PAT appointment   All instructions explained to the patient, with a verbal understanding of the material. Patient agrees to go over the instructions while at home for a better understanding. Patient also instructed to self quarantine after being tested for COVID-19. The opportunity to ask questions was provided.

## 2022-06-19 ENCOUNTER — Inpatient Hospital Stay: Payer: BC Managed Care – PPO | Attending: Psychiatry | Admitting: Gynecologic Oncology

## 2022-06-19 VITALS — BP 112/53 | HR 68 | Temp 98.0°F | Resp 14 | Wt 179.6 lb

## 2022-06-19 DIAGNOSIS — R1900 Intra-abdominal and pelvic swelling, mass and lump, unspecified site: Secondary | ICD-10-CM

## 2022-06-19 MED ORDER — SENNOSIDES-DOCUSATE SODIUM 8.6-50 MG PO TABS: 2.0000 | ORAL_TABLET | Freq: Every day | ORAL | 0 refills | Status: DC

## 2022-06-19 MED ORDER — TRAMADOL HCL 50 MG PO TABS: 50.0000 mg | ORAL_TABLET | Freq: Four times a day (QID) | ORAL | 0 refills | Status: DC | PRN

## 2022-06-19 MED ORDER — IBUPROFEN 800 MG PO TABS: 800.0000 mg | ORAL_TABLET | Freq: Three times a day (TID) | ORAL | 0 refills | Status: DC | PRN

## 2022-06-19 NOTE — Progress Notes (Unsigned)
Patient here for a pre-operative appointment prior to her scheduled surgery on June 23, 2022. She is scheduled for robotic assisted laparoscopic unilateral salpingectomy versus unilateral salpingo-oophorectomy, possible bilateral salpingo-oophorectomy, possible total hysterectomy, possible staging. She has her pre-admission testing appointment on 06/22/22 at [facility].  The surgery was discussed in detail.  See after visit summary for additional details. Visual aids used to discuss items related to surgery including sequential compression stockings, foley catheter, IV pump, multi-modal pain regimen including tylenol, photo of the surgical robot, female reproductive system to discuss surgery in detail.      Discussed post-op pain management in detail including the aspects of the enhanced recovery pathway.  Advised her that a new prescription would be sent in for tramadol and it is only to be used for after her upcoming surgery.  We discussed the use of tylenol post-op and to monitor for a maximum of 4,000 mg in a 24 hour period.  Also prescribed sennakot to be used after surgery and to hold if having loose stools.  Discussed bowel regimen in detail.     Discussed the use of SCDs and measures to take at home to prevent DVT including frequent mobility.  Reportable signs and symptoms of DVT discussed. Post-operative instructions discussed and expectations for after surgery. Incisional care discussed as well including reportable signs and symptoms including erythema, drainage, wound separation.     10 minutes spent with the patient and preparing information.  Verbalizing understanding of material discussed. No needs or concerns voiced at the end of the visit.   Advised patient to call for any needs.  Advised that her post-operative medications had been prescribed and could be picked up at any time.    This appointment is included in the global surgical bundle as pre-operative teaching and has no charge.

## 2022-06-19 NOTE — Patient Instructions (Signed)
Preparing for your Surgery  Plan for surgery on June 23, 2022 with Dr. Bernadene Bell at [place] will be scheduled for robotic assisted laparoscopic unilateral salpingectomy (removal of fallopian tube) versus unilateral salpingo-oophorectomy (removal of one ovary and fallopian tube), possible bilateral salpingo-oophorectomy (removal of both ovaries and fallopian tubes), possible total hysterectomy (removal of the uterus and cervix), possible staging (omentectomy, pelvic/para-aortic lymph node dissection, biopsies).   Pre-operative Testing -(Done, 1/8) You will receive a phone call from presurgical testing at [facility] to arrange for a pre-operative appointment and lab work.  -Bring your insurance card, copy of an advanced directive if applicable, medication list  -At that visit, you will be asked to sign a consent for a possible blood transfusion in case a transfusion becomes necessary during surgery.  The need for a blood transfusion is rare but having consent is a necessary part of your care.     -You should not be taking blood thinners or aspirin at least ten days prior to surgery unless instructed by your surgeon.  -Do not take supplements such as fish oil (omega 3), red yeast rice, turmeric before your surgery. You want to avoid medications with aspirin in them including headache powders such as BC or Goody's), Excedrin migraine.  Day Before Surgery at [place] will be asked to take in a light diet the day before surgery. You will be advised you can have clear liquids up until 3 hours before your surgery.    Eat a light diet the day before surgery.  Examples including soups, broths, toast, yogurt, mashed potatoes.  AVOID GAS PRODUCING FOODS. Things to avoid include carbonated beverages (fizzy beverages, sodas), raw fruits and raw vegetables (uncooked), or beans.   If your bowels are filled with gas, your surgeon will have difficulty visualizing your pelvic  organs which increases your surgical risks.  Your role in recovery Your role is to become active as soon as directed by your doctor, while still giving yourself time to heal.  Rest when you feel tired. You will be asked to do the following in order to speed your recovery:  - Cough and breathe deeply. This helps to clear and expand your lungs and can prevent pneumonia after surgery.  - [place]. Do mild physical activity. Walking or moving your legs help your circulation and body functions return to normal. Do not try to get up or walk alone the first time after surgery.   -If you develop swelling on one leg or the other, pain in the back of your leg, redness/warmth in one of your legs, please call the office or go to the Emergency Room to have a doppler to rule out a blood clot. For shortness of breath, chest pain-seek care in the Emergency Room as soon as possible. - Actively manage your pain. Managing your pain lets you move in comfort. We will ask you to rate your pain on a scale of zero to 10. It is your responsibility to tell your doctor or nurse where and how much you hurt so your pain can be treated.  Special Considerations -If you are diabetic, you may be placed on insulin after surgery to have closer control over your blood sugars to promote healing and recovery.  This does not mean that you will be discharged on insulin.  If applicable, your oral antidiabetics will be resumed when you are tolerating a solid diet.  -Your final pathology results from surgery should  be available around one week after surgery and the results will be relayed to you when available.  -Dr. Lahoma Crocker is the surgeon that assists your GYN Oncologist with surgery.  If you end up staying the night, the next day after your surgery you will either see Dr. Berline Lopes, Dr. Ernestina Patches, or Dr. Lahoma Crocker.  -FMLA forms can be faxed to [phone] and please allow 5-7 business days for  completion.  Pain Management After Surgery -You have been prescribed your pain medication and bowel regimen medications before surgery so that you can have these available when you are discharged from the hospital. The pain medication is for use ONLY AFTER surgery and a new prescription will not be given.   -Make sure that you have Tylenol and Ibuprofen IF YOU ARE ABLE TO TAKE THESE MEDICATIONS at home to use on a regular basis after surgery for pain control. We recommend alternating the medications every hour to six hours since they work differently and are processed in the body differently for pain relief.  -Review the attached handout on narcotic use and their risks and side effects.   Bowel Regimen -You have been prescribed Sennakot-S to take nightly to prevent constipation especially if you are taking the narcotic pain medication intermittently.  It is important to prevent constipation and drink adequate amounts of liquids. You can stop taking this medication when you are not taking pain medication and you are back on your normal bowel routine.  Risks of Surgery Risks of surgery are low but include bleeding, infection, damage to surrounding structures, re-operation, blood clots, and very rarely death.   Blood Transfusion Information (For the consent to be signed before surgery)  We will be checking your blood type before surgery so in case of emergencies, we will know what type of blood you would need.                                            WHAT IS A BLOOD TRANSFUSION?  A transfusion is the replacement of blood or some of its parts. Blood is made up of multiple cells which provide different functions. Red blood cells carry oxygen and are used for blood loss replacement. White blood cells fight against infection. Platelets control bleeding. Plasma helps clot blood. Other blood products are available for specialized needs, such as hemophilia or other clotting disorders. BEFORE THE  TRANSFUSION  Who gives blood for transfusions?  You may be able to donate blood to be used at a later date on yourself (autologous donation). Relatives can be asked to donate blood. This is generally not any safer than if you have received blood from a stranger. The same precautions are taken to ensure safety when a relative's blood is donated. Healthy volunteers who are fully evaluated to make sure their blood is safe. This is blood bank blood. Transfusion therapy is the safest it has ever been in the practice of medicine. Before blood is taken from a donor, a complete history is taken to make sure that person has no history of diseases nor engages in risky social behavior (examples are intravenous drug use or sexual activity with multiple partners). The donor's travel history is screened to minimize risk of transmitting infections, such as malaria. The donated blood is tested for signs of infectious diseases, such as HIV and hepatitis. The blood is then tested to be sure  it is compatible with you in order to minimize the chance of a transfusion reaction. If you or a relative donates blood, this is often done in anticipation of surgery and is not appropriate for emergency situations. It takes many days to process the donated blood. RISKS AND COMPLICATIONS Although transfusion therapy is very safe and saves many lives, the main dangers of transfusion include:  Getting an infectious disease. Developing a transfusion reaction. This is an allergic reaction to something in the blood you were given. Every precaution is taken to prevent this. The decision to have a blood transfusion has been considered carefully by your caregiver before blood is given. Blood is not given unless the benefits outweigh the risks.  AFTER SURGERY INSTRUCTIONS  Return to work: 4-6 weeks if applicable  Activity: 1. Be up and out of the bed during the day.  Take a nap if needed.  You may walk up steps but be careful and use the  hand rail.  Stair climbing will tire you more than you think, you may need to stop part way and rest.   2. No lifting or straining for 6 weeks over 10 pounds. No pushing, pulling, straining for 6 weeks.  3. No driving for around 1 week(s).  Do not drive if you are taking narcotic pain medicine and make sure that your reaction time has returned.   4. You can shower as soon as the next day after surgery. Shower daily.  Use your regular soap and water (not directly on the incision) and pat your incision(s) dry afterwards; don't rub.  No tub baths or submerging your body in water until cleared by your surgeon. If you have the soap that was given to you by pre-surgical testing that was used before surgery, you do not need to use it afterwards because this can irritate your incisions.   5. No sexual activity and nothing in the vagina for 4 weeks, 10-12 if you have a hysterectomy (removal of the uterus and cervix).  6. You may experience a small amount of clear drainage from your incisions, which is normal.  If the drainage persists, increases, or changes color please call the office.  7. Do not use creams, lotions, or ointments such as neosporin on your incisions after surgery until advised by your surgeon because they can cause removal of the dermabond glue on your incisions.    8. You may experience vaginal spotting after surgery or around the 6-8 week mark from surgery when the stitches at the top of the vagina begin to dissolve (if you have a hysterectomy).  The spotting is normal but if you experience heavy bleeding, call our office.  9. Take Tylenol or ibuprofen first for pain if you are able to take these medications and only use narcotic pain medication for severe pain not relieved by the Tylenol or Ibuprofen.  Monitor your Tylenol intake to a max of 4,000 mg in a 24 hour period. You can alternate these medications after surgery.  Diet: 1. Low sodium Heart Healthy Diet is recommended but you are  cleared to resume your normal (before surgery) diet after your procedure.  2. It is safe to use a laxative, such as Miralax or Colace, if you have difficulty moving your bowels. You have been prescribed Sennakot-S to take at bedtime every evening after surgery to keep bowel movements regular and to prevent constipation.    Wound Care: 1. Keep clean and dry.  Shower daily.  Reasons to call the  Doctor: Fever - Oral temperature greater than 100.4 degrees Fahrenheit Foul-smelling vaginal discharge Difficulty urinating Nausea and vomiting Increased pain at the site of the incision that is unrelieved with pain medicine. Difficulty breathing with or without chest pain New calf pain especially if only on one side Sudden, continuing increased vaginal bleeding with or without clots.   Contacts: For questions or concerns you should contact:  Dr. Bernadene Bell at [place], NP at [phone]  After Hours: call [phone] and have the GYN Oncologist paged/contacted (after 5 pm or on the weekends).  Messages sent via mychart are for non-urgent matters and are not responded to after hours so for urgent needs, please call the after hours number.

## 2022-06-22 ENCOUNTER — Other Ambulatory Visit: Payer: Self-pay

## 2022-06-22 ENCOUNTER — Encounter (HOSPITAL_COMMUNITY)
Admission: RE | Admit: 2022-06-22 | Discharge: 2022-06-22 | Disposition: A | Discharge status: Home / Self Care | Payer: BC Managed Care – PPO | Source: Ambulatory Visit | Attending: Psychiatry | Admitting: Psychiatry

## 2022-06-22 ENCOUNTER — Encounter (HOSPITAL_COMMUNITY): Payer: Self-pay

## 2022-06-22 DIAGNOSIS — D649 Anemia, unspecified: Secondary | ICD-10-CM | POA: Diagnosis not present

## 2022-06-22 DIAGNOSIS — Z01812 Encounter for preprocedural laboratory examination: Secondary | ICD-10-CM | POA: Insufficient documentation

## 2022-06-22 DIAGNOSIS — N939 Abnormal uterine and vaginal bleeding, unspecified: Secondary | ICD-10-CM

## 2022-06-22 DIAGNOSIS — N838 Other noninflammatory disorders of ovary, fallopian tube and broad ligament: Secondary | ICD-10-CM | POA: Diagnosis not present

## 2022-06-22 DIAGNOSIS — N7011 Chronic salpingitis: Secondary | ICD-10-CM | POA: Diagnosis not present

## 2022-06-22 DIAGNOSIS — R1900 Intra-abdominal and pelvic swelling, mass and lump, unspecified site: Secondary | ICD-10-CM

## 2022-06-22 DIAGNOSIS — D759 Disease of blood and blood-forming organs, unspecified: Secondary | ICD-10-CM | POA: Diagnosis not present

## 2022-06-22 DIAGNOSIS — N80203 Endometriosis of bilateral fallopian tubes, unspecified depth: Secondary | ICD-10-CM | POA: Diagnosis not present

## 2022-06-22 HISTORY — DX: Depression, unspecified: F32.A

## 2022-06-22 HISTORY — DX: Headache, unspecified: R51.9

## 2022-06-22 HISTORY — DX: Hypothyroidism, unspecified: E03.9

## 2022-06-22 HISTORY — DX: Other complications of anesthesia, initial encounter: T88.59XA

## 2022-06-22 HISTORY — DX: Anxiety disorder, unspecified: F41.9

## 2022-06-22 HISTORY — DX: Anemia, unspecified: D64.9

## 2022-06-22 HISTORY — DX: Nausea with vomiting, unspecified: R11.2

## 2022-06-22 HISTORY — DX: Other specified postprocedural states: Z98.890

## 2022-06-22 LAB — COMPREHENSIVE METABOLIC PANEL
ALT: 65 U/L — ABNORMAL HIGH (ref 0–44)
AST: 32 U/L (ref 15–41)
Albumin: 3.4 g/dL — ABNORMAL LOW (ref 3.5–5.0)
Alkaline Phosphatase: 63 U/L (ref 38–126)
Anion gap: 8 (ref 5–15)
BUN: 17 mg/dL (ref 6–20)
CO2: 27 mmol/L (ref 22–32)
Calcium: 9 mg/dL (ref 8.9–10.3)
Chloride: 104 mmol/L (ref 98–111)
Creatinine, Ser: 0.92 mg/dL (ref 0.44–1.00)
GFR, Estimated: >60 mL/min (ref >60)
Glucose, Bld: 82 mg/dL (ref 70–99)
Potassium: 4 mmol/L (ref 3.5–5.1)
Sodium: 139 mmol/L (ref 135–145)
Total Bilirubin: 0.7 mg/dL (ref 0.3–1.2)
Total Protein: 6.7 g/dL (ref 6.5–8.1)

## 2022-06-22 LAB — CBC
HCT: 39.1 % (ref 36.0–46.0)
Hemoglobin: 12.8 g/dL (ref 12.0–15.0)
MCH: 29.7 pg (ref 26.0–34.0)
MCHC: 32.7 g/dL (ref 30.0–36.0)
MCV: 90.7 fL (ref 80.0–100.0)
Platelets: 236 10*3/uL (ref 150–400)
RBC: 4.31 MIL/uL (ref 3.87–5.11)
RDW: 12.6 % (ref 11.5–15.5)
WBC: 5.8 10*3/uL (ref 4.0–10.5)
nRBC: 0 % (ref 0.0–0.2)

## 2022-06-22 NOTE — Anesthesia Preprocedure Evaluation (Signed)
Anesthesia Evaluation  Patient identified by MRN, date of birth, ID band Patient awake    Reviewed: Allergy & Precautions, NPO status , Patient's Chart, lab work & pertinent test results  History of Anesthesia Complications (+) PONV and history of anesthetic complications  Airway Mallampati: II  TM Distance: >3 FB Neck ROM: Full    Dental no notable dental hx. (+) Teeth Intact, Dental Advisory Given   Pulmonary neg pulmonary ROS   Pulmonary exam normal breath sounds clear to auscultation       Cardiovascular negative cardio ROS Normal cardiovascular exam Rhythm:Regular Rate:Normal     Neuro/Psych  Headaches PSYCHIATRIC DISORDERS Anxiety Depression       GI/Hepatic negative GI ROS, Neg liver ROS,,,  Endo/Other  Hypothyroidism    Renal/GU negative Renal ROS  negative genitourinary   Musculoskeletal negative musculoskeletal ROS (+)    Abdominal   Peds  Hematology  (+) Blood dyscrasia, anemia   Anesthesia Other Findings   Reproductive/Obstetrics                             Anesthesia Physical Anesthesia Plan  ASA: 2  Anesthesia Plan: General   Post-op Pain Management: Tylenol PO (pre-op)*   Induction: Intravenous  PONV Risk Score and Plan: 4 or greater and Midazolam, Dexamethasone, Ondansetron and Scopolamine patch - Pre-op  Airway Management Planned: Oral ETT  Additional Equipment:   Intra-op Plan:   Post-operative Plan: Extubation in OR  Informed Consent: I have reviewed the patients History and Physical, chart, labs and discussed the procedure including the risks, benefits and alternatives for the proposed anesthesia with the patient or authorized representative who has indicated his/her understanding and acceptance.     Dental advisory given  Plan Discussed with: CRNA  Anesthesia Plan Comments: (2 IVs)       Anesthesia Quick Evaluation

## 2022-06-23 ENCOUNTER — Ambulatory Visit (HOSPITAL_COMMUNITY): Payer: BC Managed Care – PPO | Admitting: Anesthesiology

## 2022-06-23 ENCOUNTER — Other Ambulatory Visit: Payer: Self-pay

## 2022-06-23 ENCOUNTER — Encounter (HOSPITAL_COMMUNITY)
Admission: RE | Disposition: A | Discharge status: Home / Self Care | Payer: Self-pay | Source: Home / Self Care | Attending: Psychiatry

## 2022-06-23 ENCOUNTER — Encounter (HOSPITAL_COMMUNITY): Payer: Self-pay | Admitting: Psychiatry

## 2022-06-23 ENCOUNTER — Ambulatory Visit (HOSPITAL_COMMUNITY)
Admission: RE | Admit: 2022-06-23 | Discharge: 2022-06-23 | Disposition: A | Discharge status: Home / Self Care | Payer: BC Managed Care – PPO | Attending: Psychiatry | Admitting: Psychiatry

## 2022-06-23 DIAGNOSIS — N736 Female pelvic peritoneal adhesions (postinfective): Secondary | ICD-10-CM | POA: Diagnosis not present

## 2022-06-23 DIAGNOSIS — R1900 Intra-abdominal and pelvic swelling, mass and lump, unspecified site: Secondary | ICD-10-CM

## 2022-06-23 DIAGNOSIS — R896 Abnormal cytological findings in specimens from other organs, systems and tissues: Secondary | ICD-10-CM | POA: Diagnosis not present

## 2022-06-23 DIAGNOSIS — N836 Hematosalpinx: Secondary | ICD-10-CM | POA: Diagnosis not present

## 2022-06-23 DIAGNOSIS — D649 Anemia, unspecified: Secondary | ICD-10-CM | POA: Insufficient documentation

## 2022-06-23 DIAGNOSIS — N7011 Chronic salpingitis: Secondary | ICD-10-CM

## 2022-06-23 DIAGNOSIS — N939 Abnormal uterine and vaginal bleeding, unspecified: Secondary | ICD-10-CM | POA: Insufficient documentation

## 2022-06-23 DIAGNOSIS — N838 Other noninflammatory disorders of ovary, fallopian tube and broad ligament: Secondary | ICD-10-CM | POA: Insufficient documentation

## 2022-06-23 DIAGNOSIS — D759 Disease of blood and blood-forming organs, unspecified: Secondary | ICD-10-CM | POA: Diagnosis not present

## 2022-06-23 DIAGNOSIS — N80203 Endometriosis of bilateral fallopian tubes, unspecified depth: Secondary | ICD-10-CM | POA: Diagnosis not present

## 2022-06-23 HISTORY — PX: ROBOTIC ASSISTED SALPINGO OOPHERECTOMY: SHX6082

## 2022-06-23 LAB — TYPE AND SCREEN
ABO/RH(D): AB NEG
Antibody Screen: NEGATIVE

## 2022-06-23 LAB — ABO/RH: ABO/RH(D): AB NEG

## 2022-06-23 LAB — POCT PREGNANCY, URINE: Preg Test, Ur: NEGATIVE

## 2022-06-23 SURGERY — SALPINGO-OOPHORECTOMY, ROBOT-ASSISTED
Anesthesia: General

## 2022-06-23 MED ORDER — DEXAMETHASONE SODIUM PHOSPHATE 10 MG/ML IJ SOLN
INTRAMUSCULAR | Status: AC
  Filled 2022-06-23: qty 1

## 2022-06-23 MED ORDER — MIDAZOLAM HCL 2 MG/2ML IJ SOLN
INTRAMUSCULAR | Status: DC | PRN
  Administered 2022-06-23: 2 mg via INTRAVENOUS

## 2022-06-23 MED ORDER — OXYCODONE HCL 5 MG PO TABS: 5.0000 mg | ORAL_TABLET | Freq: Once | ORAL | Status: AC | PRN

## 2022-06-23 MED ORDER — LIDOCAINE HCL (PF) 2 % IJ SOLN
INTRAMUSCULAR | Status: AC
  Filled 2022-06-23: qty 5

## 2022-06-23 MED ORDER — PROPOFOL 10 MG/ML IV BOLUS
INTRAVENOUS | Status: AC
  Filled 2022-06-23: qty 20

## 2022-06-23 MED ORDER — FENTANYL CITRATE PF 50 MCG/ML IJ SOSY
25.0000 ug | PREFILLED_SYRINGE | INTRAMUSCULAR | Status: DC | PRN
  Administered 2022-06-23: 50 ug via INTRAVENOUS

## 2022-06-23 MED ORDER — CHLORHEXIDINE GLUCONATE 0.12 % MT SOLN
15.0000 mL | Freq: Once | OROMUCOSAL | Status: AC
  Administered 2022-06-23: 15 mL via OROMUCOSAL

## 2022-06-23 MED ORDER — FENTANYL CITRATE PF 50 MCG/ML IJ SOSY
PREFILLED_SYRINGE | INTRAMUSCULAR | Status: AC
  Filled 2022-06-23: qty 2

## 2022-06-23 MED ORDER — BUPIVACAINE HCL 0.25 % IJ SOLN
INTRAMUSCULAR | Status: AC
  Filled 2022-06-23: qty 1

## 2022-06-23 MED ORDER — KETAMINE HCL 10 MG/ML IJ SOLN
INTRAMUSCULAR | Status: DC | PRN
  Administered 2022-06-23: 30 mg via INTRAVENOUS

## 2022-06-23 MED ORDER — FENTANYL CITRATE (PF) 250 MCG/5ML IJ SOLN
INTRAMUSCULAR | Status: DC | PRN
  Administered 2022-06-23: 100 ug via INTRAVENOUS
  Administered 2022-06-23: 50 ug via INTRAVENOUS

## 2022-06-23 MED ORDER — DEXAMETHASONE SODIUM PHOSPHATE 10 MG/ML IJ SOLN
INTRAMUSCULAR | Status: DC | PRN
  Administered 2022-06-23: 8 mg via INTRAVENOUS

## 2022-06-23 MED ORDER — SCOPOLAMINE 1 MG/3DAYS TD PT72
1.0000 | MEDICATED_PATCH | TRANSDERMAL | Status: DC
  Administered 2022-06-23: 1.5 mg via TRANSDERMAL
  Filled 2022-06-23: qty 1

## 2022-06-23 MED ORDER — ROCURONIUM BROMIDE 10 MG/ML (PF) SYRINGE
PREFILLED_SYRINGE | INTRAVENOUS | Status: AC
  Filled 2022-06-23: qty 10

## 2022-06-23 MED ORDER — LACTATED RINGERS IV SOLN: INTRAVENOUS | Status: DC | PRN

## 2022-06-23 MED ORDER — LACTATED RINGERS IV SOLN: INTRAVENOUS | Status: DC

## 2022-06-23 MED ORDER — STERILE WATER FOR IRRIGATION IR SOLN
Status: DC | PRN
  Administered 2022-06-23: 1000 mL

## 2022-06-23 MED ORDER — ACETAMINOPHEN 500 MG PO TABS: 1000.0000 mg | ORAL_TABLET | ORAL | Status: DC

## 2022-06-23 MED ORDER — ONDANSETRON HCL 4 MG/2ML IJ SOLN
INTRAMUSCULAR | Status: AC
  Filled 2022-06-23: qty 2

## 2022-06-23 MED ORDER — KETAMINE HCL 50 MG/5ML IJ SOSY
PREFILLED_SYRINGE | INTRAMUSCULAR | Status: AC
  Filled 2022-06-23: qty 5

## 2022-06-23 MED ORDER — PROPOFOL 10 MG/ML IV BOLUS
INTRAVENOUS | Status: DC | PRN
  Administered 2022-06-23: 120 mg via INTRAVENOUS

## 2022-06-23 MED ORDER — ROCURONIUM BROMIDE 10 MG/ML (PF) SYRINGE
PREFILLED_SYRINGE | INTRAVENOUS | Status: DC | PRN
  Administered 2022-06-23: 80 mg via INTRAVENOUS

## 2022-06-23 MED ORDER — ACETAMINOPHEN 500 MG PO TABS
1000.0000 mg | ORAL_TABLET | Freq: Once | ORAL | Status: AC
  Administered 2022-06-23: 1000 mg via ORAL
  Filled 2022-06-23: qty 2

## 2022-06-23 MED ORDER — MIDAZOLAM HCL 2 MG/2ML IJ SOLN
INTRAMUSCULAR | Status: AC
  Filled 2022-06-23: qty 2

## 2022-06-23 MED ORDER — GABAPENTIN 300 MG PO CAPS
300.0000 mg | ORAL_CAPSULE | ORAL | Status: AC
  Administered 2022-06-23: 300 mg via ORAL
  Filled 2022-06-23: qty 1

## 2022-06-23 MED ORDER — KETOROLAC TROMETHAMINE 15 MG/ML IJ SOLN: 15.0000 mg | INTRAMUSCULAR | Status: DC

## 2022-06-23 MED ORDER — STERILE WATER FOR INJECTION IJ SOLN
INTRAMUSCULAR | Status: AC
  Filled 2022-06-23: qty 10

## 2022-06-23 MED ORDER — BUPIVACAINE HCL 0.25 % IJ SOLN
INTRAMUSCULAR | Status: DC | PRN
  Administered 2022-06-23: 21 mL

## 2022-06-23 MED ORDER — LIDOCAINE 2% (20 MG/ML) 5 ML SYRINGE
INTRAMUSCULAR | Status: DC | PRN
  Administered 2022-06-23: 1.5 mg/kg/h via INTRAVENOUS
  Administered 2022-06-23: 60 mg via INTRAVENOUS

## 2022-06-23 MED ORDER — FENTANYL CITRATE (PF) 250 MCG/5ML IJ SOLN
INTRAMUSCULAR | Status: AC
  Filled 2022-06-23: qty 5

## 2022-06-23 MED ORDER — ONDANSETRON HCL 4 MG/2ML IJ SOLN
INTRAMUSCULAR | Status: DC | PRN
  Administered 2022-06-23: 4 mg via INTRAVENOUS

## 2022-06-23 MED ORDER — LACTATED RINGERS IR SOLN
Status: DC | PRN
  Administered 2022-06-23: 1000 mL

## 2022-06-23 MED ORDER — LIDOCAINE HCL (PF) 2 % IJ SOLN
INTRAMUSCULAR | Status: AC
  Filled 2022-06-23: qty 15

## 2022-06-23 MED ORDER — DEXAMETHASONE SODIUM PHOSPHATE 4 MG/ML IJ SOLN: 4.0000 mg | INTRAMUSCULAR | Status: DC

## 2022-06-23 MED ORDER — ORAL CARE MOUTH RINSE: 15.0000 mL | Freq: Once | OROMUCOSAL | Status: AC

## 2022-06-23 MED ORDER — OXYCODONE HCL 5 MG PO TABS
ORAL_TABLET | ORAL | Status: AC
  Administered 2022-06-23: 5 mg via ORAL
  Filled 2022-06-23: qty 1

## 2022-06-23 MED ORDER — HEPARIN SODIUM (PORCINE) 5000 UNIT/ML IJ SOLN
5000.0000 [IU] | INTRAMUSCULAR | Status: AC
  Administered 2022-06-23: 5000 [IU] via SUBCUTANEOUS
  Filled 2022-06-23: qty 1

## 2022-06-23 MED ORDER — SUGAMMADEX SODIUM 200 MG/2ML IV SOLN
INTRAVENOUS | Status: DC | PRN
  Administered 2022-06-23: 200 mg via INTRAVENOUS

## 2022-06-23 SURGICAL SUPPLY — 76 items
APPLICATOR SURGIFLO ENDO (HEMOSTASIS) IMPLANT
BAG LAPAROSCOPIC 12 15 PORT 16 (BASKET) IMPLANT
BAG RETRIEVAL 12/15 (BASKET)
BLADE SURG SZ10 CARB STEEL (BLADE) IMPLANT
COVER BACK TABLE 60X90IN (DRAPES) ×1 IMPLANT
COVER TIP SHEARS 8 DVNC (MISCELLANEOUS) ×1 IMPLANT
COVER TIP SHEARS 8MM DA VINCI (MISCELLANEOUS) ×1
DERMABOND ADVANCED .7 DNX12 (GAUZE/BANDAGES/DRESSINGS) ×1 IMPLANT
DRAPE ARM DVNC X/XI (DISPOSABLE) ×4 IMPLANT
DRAPE COLUMN DVNC XI (DISPOSABLE) ×1 IMPLANT
DRAPE DA VINCI XI ARM (DISPOSABLE) ×4
DRAPE DA VINCI XI COLUMN (DISPOSABLE) ×1
DRAPE SHEET LG 3/4 BI-LAMINATE (DRAPES) ×1 IMPLANT
DRAPE SURG IRRIG POUCH 19X23 (DRAPES) ×1 IMPLANT
DRSG OPSITE POSTOP 4X6 (GAUZE/BANDAGES/DRESSINGS) IMPLANT
DRSG OPSITE POSTOP 4X8 (GAUZE/BANDAGES/DRESSINGS) IMPLANT
ELECT PENCIL ROCKER SW 15FT (MISCELLANEOUS) IMPLANT
ELECT REM PT RETURN 15FT ADLT (MISCELLANEOUS) ×1 IMPLANT
GAUZE 4X4 16PLY ~~LOC~~+RFID DBL (SPONGE) ×1 IMPLANT
GLOVE BIO SURGEON STRL SZ 6 (GLOVE) ×4 IMPLANT
GLOVE BIO SURGEON STRL SZ 6.5 (GLOVE) ×1 IMPLANT
GLOVE BIOGEL PI IND STRL 6.5 (GLOVE) ×2 IMPLANT
GOWN STRL REUS W/ TWL LRG LVL3 (GOWN DISPOSABLE) ×4 IMPLANT
GOWN STRL REUS W/TWL LRG LVL3 (GOWN DISPOSABLE) ×4
GRASPER SUT TROCAR 14GX15 (MISCELLANEOUS) IMPLANT
HOLDER FOLEY CATH W/STRAP (MISCELLANEOUS) IMPLANT
IRRIG SUCT STRYKERFLOW 2 WTIP (MISCELLANEOUS) ×1
IRRIGATION SUCT STRKRFLW 2 WTP (MISCELLANEOUS) ×1 IMPLANT
KIT PROCEDURE DA VINCI SI (MISCELLANEOUS)
KIT PROCEDURE DVNC SI (MISCELLANEOUS) IMPLANT
KIT TURNOVER KIT A (KITS) IMPLANT
LIGASURE IMPACT 36 18CM CVD LR (INSTRUMENTS) IMPLANT
MANIPULATOR ADVINCU DEL 3.0 PL (MISCELLANEOUS) IMPLANT
MANIPULATOR ADVINCU DEL 3.5 PL (MISCELLANEOUS) IMPLANT
MANIPULATOR UTERINE 4.5 ZUMI (MISCELLANEOUS) IMPLANT
NDL HYPO 21X1.5 SAFETY (NEEDLE) ×1 IMPLANT
NDL INSUFFLATION 14GA 120MM (NEEDLE) IMPLANT
NDL SPNL 18GX3.5 QUINCKE PK (NEEDLE) IMPLANT
NEEDLE HYPO 21X1.5 SAFETY (NEEDLE) ×1 IMPLANT
NEEDLE INSUFFLATION 14GA 120MM (NEEDLE) IMPLANT
NEEDLE SPNL 18GX3.5 QUINCKE PK (NEEDLE) IMPLANT
OBTURATOR OPTICAL STANDARD 8MM (TROCAR) ×1
OBTURATOR OPTICAL STND 8 DVNC (TROCAR) ×1
OBTURATOR OPTICALSTD 8 DVNC (TROCAR) ×1 IMPLANT
PACK ROBOT GYN CUSTOM WL (TRAY / TRAY PROCEDURE) ×1 IMPLANT
PAD ARMBOARD 7.5X6 YLW CONV (MISCELLANEOUS) ×1 IMPLANT
PAD POSITIONING PINK XL (MISCELLANEOUS) ×1 IMPLANT
PORT ACCESS TROCAR AIRSEAL 12 (TROCAR) IMPLANT
SEAL CANN UNIV 5-8 DVNC XI (MISCELLANEOUS) ×4 IMPLANT
SEAL XI 5MM-8MM UNIVERSAL (MISCELLANEOUS) ×4
SET TRI-LUMEN FLTR TB AIRSEAL (TUBING) ×1 IMPLANT
SOL PREP POV-IOD 4OZ 10% (MISCELLANEOUS) ×2 IMPLANT
SPIKE FLUID TRANSFER (MISCELLANEOUS) ×1 IMPLANT
SPONGE T-LAP 18X18 ~~LOC~~+RFID (SPONGE) IMPLANT
SURGIFLO W/THROMBIN 8M KIT (HEMOSTASIS) IMPLANT
SUT MNCRL AB 4-0 PS2 18 (SUTURE) IMPLANT
SUT PDS AB 1 TP1 96 (SUTURE) IMPLANT
SUT VIC AB 0 CT1 27 (SUTURE)
SUT VIC AB 0 CT1 27XBRD ANTBC (SUTURE) IMPLANT
SUT VIC AB 2-0 CT1 27 (SUTURE)
SUT VIC AB 2-0 CT1 TAPERPNT 27 (SUTURE) IMPLANT
SUT VIC AB 4-0 PS2 18 (SUTURE) ×2 IMPLANT
SUT VLOC 180 0 9IN  GS21 (SUTURE)
SUT VLOC 180 0 9IN GS21 (SUTURE) IMPLANT
SYR 10ML LL (SYRINGE) IMPLANT
SYS BAG RETRIEVAL 10MM (BASKET) ×2
SYS WOUND ALEXIS 18CM MED (MISCELLANEOUS)
SYSTEM BAG RETRIEVAL 10MM (BASKET) IMPLANT
SYSTEM WOUND ALEXIS 18CM MED (MISCELLANEOUS) IMPLANT
TOWEL OR NON WOVEN STRL DISP B (DISPOSABLE) IMPLANT
TRAP SPECIMEN MUCUS 40CC (MISCELLANEOUS) IMPLANT
TRAY FOLEY MTR SLVR 16FR STAT (SET/KITS/TRAYS/PACK) ×1 IMPLANT
TROCAR PORT AIRSEAL 5X120 (TROCAR) IMPLANT
UNDERPAD 30X36 HEAVY ABSORB (UNDERPADS AND DIAPERS) ×2 IMPLANT
WATER STERILE IRR 1000ML POUR (IV SOLUTION) ×1 IMPLANT
YANKAUER SUCT BULB TIP 10FT TU (MISCELLANEOUS) IMPLANT

## 2022-06-23 NOTE — Anesthesia Procedure Notes (Signed)
Procedure Name: Intubation Date/Time: 06/23/2022 7:44 AM  Performed by: Sharlette Dense, CRNAPre-anesthesia Checklist: Patient identified, Emergency Drugs available, Suction available and Patient being monitored Patient Re-evaluated:Patient Re-evaluated prior to induction Oxygen Delivery Method: Circle system utilized Preoxygenation: Pre-oxygenation with 100% oxygen Induction Type: IV induction Ventilation: Mask ventilation without difficulty Laryngoscope Size: Miller and 2 Grade View: Grade I Tube type: Oral Tube size: 7.5 mm Number of attempts: 1 Airway Equipment and Method: Stylet Placement Confirmation: ETT inserted through vocal cords under direct vision, positive ETCO2 and breath sounds checked- equal and bilateral Secured at: 21 cm Tube secured with: Tape Dental Injury: Teeth and Oropharynx as per pre-operative assessment

## 2022-06-23 NOTE — Progress Notes (Signed)
Per Dr. Ernestina Patches, no ABX needed for case.

## 2022-06-23 NOTE — Op Note (Signed)
GYNECOLOGIC ONCOLOGY OPERATIVE NOTE  Date of Service: 06/23/2022  Preoperative Diagnosis: Complex left hydrosalpinx  Postoperative Diagnosis: Left hematosalpinx, right hydrosalpinx  Procedures: [place], RIGHT SALPINGOECTOMY  Surgeon: Bernadene Bell, MD  Assistants: Lahoma Crocker, MD  Anesthesia: General  Estimated Blood Loss: 25 mL    Fluids: 1200 ml, crystalloid  Urine Output: 50 ml, clear yellow  Findings: On entry to abdomen, normal upper abdominal survey with smooth diaphragm, liver, stomach and normal appearing omentum and bowel. Small anteverted uterus. Complex left adnexal mass with dilated, tortuous fallopian tube. Right ovary normal in appearance. Right hydrosalpinx. Frozen pathology consistent with hematosalpinx on left.   Specimens:  ID Type Source Tests Collected by Time Destination  1 : Left Fallopian Tube and Ovary Tissue PATH Gyn tumor resection SURGICAL PATHOLOGY Bernadene Bell, MD 06/23/[phone]   2 : Right fallopian tube Tissue PATH Gyn benign resection SURGICAL PATHOLOGY Bernadene Bell, MD 06/23/2022 [phone]   A : Pelvic Washings Body Fluid PATH Cytology Pelvic Washing CYTOLOGY - NON PAP Bernadene Bell, MD 07/17/2977 [id number]     Complications:  None  Indications for Procedure: Laura Spencer is a 52 y.o. woman with a complex left adnexal mass, likely fallopian tube in origin.  Prior to the procedure, all risks, benefits, and alternatives were discussed and informed surgical consent was signed.  Procedure: Patient was taken to the operating room where general anesthesia was achieved.  She was positioned in dorsal lithotomy and prepped and draped.  A foley catheter was inserted into the bladder.  A hulka manipulator was inserted to the uterus.    A 10 mm incision was made in the left upper quadrant near Palmer's point.  The abdomen was entered with a 5 mm OptiView trocar under direct visualization.  The abdomen was  insufflated, the patient placed in steep Trendelenburg, and additional trocars were placed as follows: an 44m robotic trocar superior to the umbilicus, one 8 mm robotic trocars in the right abdomen, and one 8 mm robotic trocar in the left abdomen.  The left upper quadrant trocar was removed and replaced with a 12 mm airseal trocar.  All trocars were placed under direct visualization.  The bowels were moved into the upper abdomen.  The DaVinci robotic surgical system was brought to the patient's bedside and docked.  Pelvic washings were obtained. The left retroperitoneum entered.  The left ureter was identified.  The left infundibulopelvic ligament was isolated, cauterized, and transected.  The broad was taken to the uterine cornu.  The utero-ovarian ligament was isolated, cauterized, and transected. The specimen was placed in an Endo-Catch bag and removed through the assistant trocar for frozen evaluation which was benign. Due to an abnormal appearing fallopian tube on the right, decision was made to proceed with right salpingectomy. The mesosalpinx underlying the right fallopian tube was cauterized and transected from distal to proximal at the uterine cornua. The fallopian tube was then cauterized and transected at the cornua and removed through the assistant port.   The pelvis was irrigated and all operative sites were found to be hemostatic.  All instruments were removed and the robot was taken from the patient's bedside. The fascia at the 12 mm incision was closed with 0 Vicryl with a PMI device. The abdomen was desufflated and all ports were removed.  The skin at all incisions was closed with 4-0 Vicryl to reapproximate the subcutaneous tissue and 4-0 monocryl in a subcuticular fashion followed by surgical glue.  Patient tolerated the  procedure well. Sponge, lap, and instrument counts were correct.  No perioperative antibiotic prophylaxis was indicated for this procedure.  She was extubated and taken to  the PACU in stable condition.  Bernadene Bell, MD Gynecologic Oncology

## 2022-06-23 NOTE — Anesthesia Postprocedure Evaluation (Signed)
Anesthesia Post Note  Patient: Laura Spencer  Procedure(s) Performed: XI ROBOTIC ASSISTED RIGHT SALPINGO OOPHORECTOMY, LEFT SALPINGOECTOMY     Patient location during evaluation: PACU Anesthesia Type: General Level of consciousness: awake and alert Pain management: pain level controlled Vital Signs Assessment: post-procedure vital signs reviewed and stable Respiratory status: spontaneous breathing, nonlabored ventilation, respiratory function stable and patient connected to nasal cannula oxygen Cardiovascular status: blood pressure returned to baseline and stable Postop Assessment: no apparent nausea or vomiting Anesthetic complications: no  No notable events documented.  Last Vitals:  Vitals:   06/23/22 1130 06/23/22 1148  BP: 99/72 106/75  Pulse: (!) 55 (!) 53  Resp: 16 16  Temp:  36.6 C  SpO2: 98% 100%    Last Pain:  Vitals:   06/23/22 1148  TempSrc:   PainSc: 3                  Clayton Bosserman L Calel Pisarski

## 2022-06-23 NOTE — Transfer of Care (Signed)
Immediate Anesthesia Transfer of Care Note  Patient: Laura Spencer  Procedure(s) Performed: XI ROBOTIC ASSISTED RIGHT SALPINGO OOPHORECTOMY, LEFT SALPINGOECTOMY  Patient Location: PACU  Anesthesia Type:General  Level of Consciousness: awake and alert   Airway & Oxygen Therapy: Patient Spontanous Breathing and Patient connected to nasal cannula oxygen  Post-op Assessment: Report given to RN and Post -op Vital signs reviewed and stable  Post vital signs: Reviewed and stable  Last Vitals:  Vitals Value Taken Time  BP 106/70 06/23/22 0927  Temp    Pulse 62 06/23/22 0928  Resp 12 06/23/22 0928  SpO2 100 % 06/23/22 0928  Vitals shown include unvalidated device data.  Last Pain:  Vitals:   06/23/22 0606  TempSrc:   PainSc: 0-No pain         Complications: No notable events documented.

## 2022-06-23 NOTE — Discharge Instructions (Addendum)
AFTER SURGERY INSTRUCTIONS   Return to work: 4-6 weeks if applicable   Activity: 1. Be up and out of the bed during the day.  Take a nap if needed.  You may walk up steps but be careful and use the hand rail.  Stair climbing will tire you more than you think, you may need to stop part way and rest.    2. No lifting or straining for 6 weeks over 10 pounds. No pushing, pulling, straining for 6 weeks.   3. No driving for around 1 week(s).  Do not drive if you are taking narcotic pain medicine and make sure that your reaction time has returned.    4. You can shower as soon as the next day after surgery. Shower daily.  Use your regular soap and water (not directly on the incision) and pat your incision(s) dry afterwards; don't rub.  No tub baths or submerging your body in water until cleared by your surgeon. If you have the soap that was given to you by pre-surgical testing that was used before surgery, you do not need to use it afterwards because this can irritate your incisions.    5. No sexual activity and nothing in the vagina for 4 weeks.   6. You may experience a small amount of clear drainage from your incisions, which is normal.  If the drainage persists, increases, or changes color please call the office.   7. Do not use creams, lotions, or ointments such as neosporin on your incisions after surgery until advised by your surgeon because they can cause removal of the dermabond glue on your incisions.     8. You may experience vaginal spotting after surgery.  The spotting is normal but if you experience heavy bleeding, call our office.   9. Take Tylenol or ibuprofen first for pain if you are able to take these medications and only use narcotic pain medication for severe pain not relieved by the Tylenol or Ibuprofen.  Monitor your Tylenol intake to a max of 4,000 mg in a 24 hour period. You can alternate these medications after surgery.   Diet: 1. Low sodium Heart Healthy Diet is recommended  but you are cleared to resume your normal (before surgery) diet after your procedure.   2. It is safe to use a laxative, such as Miralax or Colace, if you have difficulty moving your bowels. You have been prescribed Sennakot-S to take at bedtime every evening after surgery to keep bowel movements regular and to prevent constipation.     Wound Care: 1. Keep clean and dry.  Shower daily.   Reasons to call the Doctor: Fever - Oral temperature greater than 100.4 degrees Fahrenheit Foul-smelling vaginal discharge Difficulty urinating Nausea and vomiting Increased pain at the site of the incision that is unrelieved with pain medicine. Difficulty breathing with or without chest pain New calf pain especially if only on one side Sudden, continuing increased vaginal bleeding with or without clots.   Contacts: For questions or concerns you should contact:   Dr. Bernadene Bell at [place], NP at [phone]   After Hours: call [phone] and have the GYN Oncologist paged/contacted (after 5 pm or on the weekends).   Messages sent via mychart are for non-urgent matters and are not responded to after hours so for urgent needs, please call the after hours number.

## 2022-06-23 NOTE — H&P (Signed)
Brief Pre-operative History & Physical  Patient name: Laura Spencer CSN: [id number] MRN: [id number] Admit Date: 06/23/2022 Date of Surgery: 06/23/2022 Performing Service: Gynecology   Code Status: Full Code    Assessment & Plan    [name] is a 52 y.o. female with pelvic mass, abnormal uterine bleeding, who presents for: Procedure(s) (LRB): XI ROBOTIC ASSISTED UNILATERAL SALPINGO OOPHORECTOMY (N/A) POSSIBLE XI ROBOTIC ASSISTED TOTAL HYSTERECTOMY, POSSIBLE BILATERAL SALPING OOPHORECTOMHY, POSSIBLE STAGING (N/A).   Consent obtained in office is accurate. Risks, benefits, and alternatives to surgery were reviewed, and all questions were answered. We additional discussed the risks/benefits of BSO at time of surgery given average age of menopause and currently going through perimenopause without completed. Patient has opted to proceed with the surgery as previously planned.   Proceed to the OR as planned.     History of Present Illness:  Laura Spencer is a 52 y.o. female with pelvic mass, abnormal uterine bleeding. She was recently seen in clinic, where a detailed HPI can be found. She was noted to benefit from: Procedure(s) (LRB): XI ROBOTIC ASSISTED UNILATERAL SALPINGO OOPHORECTOMY (N/A) POSSIBLE XI ROBOTIC ASSISTED TOTAL HYSTERECTOMY, POSSIBLE BILATERAL SALPING OOPHORECTOMHY, POSSIBLE STAGING (N/A).    Allergies Other  Medications   Current Facility-Administered Medications  Medication Dose Route Frequency Provider Last Rate Last Admin   acetaminophen (TYLENOL) tablet 1,000 mg  1,000 mg Oral On Call to [place], Melissa D, NP       dexamethasone (DECADRON) injection 4 mg  4 mg Intravenous On Call to OR Cross, Melissa D, NP       ketorolac (TORADOL) 15 MG/ML injection 15 mg  15 mg Intravenous On Call to [place], Carollee Massed, NP       lactated ringers infusion   Intravenous Continuous Nilda Simmer, MD 10 mL/hr at 06/23/22 0621 New Bag at 06/23/22 3086   scopolamine  (TRANSDERM-SCOP) 1 MG/3DAYS 1.5 mg  1 patch Transdermal On Call to OR Joylene John D, NP   1.5 mg at 06/23/22 0608    Vital Signs BP 100/73   Pulse 60   Temp 98.2 F (36.8 C) (Oral)   Resp 16   Ht '5\' 6"'$  (1.676 m)   Wt 174 lb 2.6 oz (79 kg)   LMP 03/29/2022 (Approximate) Comment: Negative urine preg POCT as of 06/23/22  SpO2 100%   BMI 28.11 kg/m  Facility age limit for growth %iles is 20 years. Facility age limit for growth %iles is 20 years..   Physical Exam General: Well developed, appears stated age, in no acute distress  Mental status: Alert and oriented x3 Cardiovascular: Normal Pulmonary: Symmetric chest rise, unlabored breathing Relevant System for Surgery: Surgical site examination deferred to the OR   Labs and Studies: Lab Results  Component Value Date   WBC 5.8 06/22/2022   HGB 12.8 06/22/2022   HCT 39.1 06/22/2022   PLT 236 06/22/2022    No results found for: "INR", "APTT" \

## 2022-06-23 NOTE — Brief Op Note (Addendum)
06/23/2022  10:18 AM  PATIENT:  Laura Spencer  52 y.o. female  PRE-OPERATIVE DIAGNOSIS:  Pelvic mass  POST-OPERATIVE DIAGNOSIS: Left hematosalpinx, right hydrosalpinx  PROCEDURE:  Procedure(s): XI ROBOTIC ASSISTED LEFT SALPINGO OOPHORECTOMY, RIGHT SALPINGOECTOMY (N/A)  SURGEON:  Surgeon(s) and Role:    Bernadene Bell, MD - Primary    * Lahoma Crocker, MD - Assisting   ASSISTANTS: Lahoma Crocker   ANESTHESIA:   general  EBL:  25 mL   BLOOD ADMINISTERED:none  DRAINS: none   LOCAL MEDICATIONS USED:  BUPIVICAINE   SPECIMEN:   ID Type Source Tests Collected by Time Destination  1 : Left Fallopian Tube and Ovary Tissue PATH Gyn tumor resection SURGICAL PATHOLOGY Bernadene Bell, MD 06/23/2022 [phone]   2 : Right fallopian tube Tissue PATH Gyn benign resection SURGICAL PATHOLOGY Bernadene Bell, MD 06/23/2022 [phone]   A : Pelvic Washings Body Fluid PATH Cytology Pelvic Washing CYTOLOGY - NON PAP Bernadene Bell, MD 06/23/2022 [phone]      DISPOSITION OF SPECIMEN:  PATHOLOGY  COUNTS:  YES  TOURNIQUET:  * No tourniquets in log *  DICTATION: .Note written in EPIC  PLAN OF CARE: Discharge to home after PACU  PATIENT DISPOSITION:  PACU - hemodynamically stable.   Delay start of Pharmacological VTE agent (>24hrs) due to surgical blood loss or risk of bleeding: not applicable

## 2022-06-24 ENCOUNTER — Telehealth: Payer: Self-pay | Admitting: *Deleted

## 2022-06-24 ENCOUNTER — Telehealth: Payer: Self-pay

## 2022-06-24 ENCOUNTER — Encounter (HOSPITAL_COMMUNITY): Payer: Self-pay | Admitting: Psychiatry

## 2022-06-24 LAB — SURGICAL PATHOLOGY

## 2022-06-24 LAB — CYTOLOGY - NON PAP

## 2022-06-24 NOTE — Telephone Encounter (Signed)
Fax family member FMLA

## 2022-06-24 NOTE — Telephone Encounter (Signed)
LVM for patient to call office regarding post op follow up

## 2022-06-24 NOTE — Telephone Encounter (Signed)
Spoke with Ms. Prestia this morning. She states she is eating, drinking and urinating well. She has not had a BM yet but is passing gas. She is taking senokot as prescribed and encouraged her to drink plenty of water. She denies fever or chills. Incisions are dry and intact. She rates her pain 6/10. Her pain is controlled with Tylenol/Ibuprofen. Has only taken Tramadol once last night    Instructed to call office with any fever, chills, purulent drainage, uncontrolled pain or any other questions or concerns. Patient verbalizes understanding.   Pt aware of post op appointments as well as the office number [phone] and after hours number [phone] to call if she has any questions or concerns

## 2022-06-26 ENCOUNTER — Encounter: Payer: Self-pay | Admitting: Gynecologic Oncology

## 2022-06-26 ENCOUNTER — Telehealth: Payer: Self-pay | Admitting: Surgery

## 2022-06-26 NOTE — Telephone Encounter (Signed)
Called patient regarding discomfort she is experiencing in her left upper abdomen around incision site. Patient denies any fevers, redness, warmth, or drainage from incision site. Denies pain, saying its more discomfort then anything. She is having regular bowel movements and is taking her senokot. She states her appetite is still poor and she feels mildly nauseated. No vomiting. Scopolamine patch came off yesterday. Advised patient to wear compressive pants such as leggings or spanx, or utilize an abdominal binder for additional support. Patient also advised that some soreness is expected after surgery, and that the site she is concerned with has a deep stitch in the muscle wall, which may be the cause of her discomfort. Requested that patient send [place] a photo of her incision for further assessment. Patient verbalized understanding and had no other concerns at this time.

## 2022-06-30 DIAGNOSIS — F4322 Adjustment disorder with anxiety: Secondary | ICD-10-CM | POA: Diagnosis not present

## 2022-07-03 DIAGNOSIS — F4322 Adjustment disorder with anxiety: Secondary | ICD-10-CM | POA: Diagnosis not present

## 2022-07-03 DIAGNOSIS — F902 Attention-deficit hyperactivity disorder, combined type: Secondary | ICD-10-CM | POA: Diagnosis not present

## 2022-07-03 DIAGNOSIS — F331 Major depressive disorder, recurrent, moderate: Secondary | ICD-10-CM | POA: Diagnosis not present

## 2022-07-03 DIAGNOSIS — F411 Generalized anxiety disorder: Secondary | ICD-10-CM | POA: Diagnosis not present

## 2022-07-04 ENCOUNTER — Encounter: Payer: Self-pay | Admitting: Gynecologic Oncology

## 2022-07-06 ENCOUNTER — Other Ambulatory Visit: Payer: Self-pay

## 2022-07-06 ENCOUNTER — Inpatient Hospital Stay (HOSPITAL_BASED_OUTPATIENT_CLINIC_OR_DEPARTMENT_OTHER): Payer: BC Managed Care – PPO | Admitting: Psychiatry

## 2022-07-06 VITALS — BP 116/71 | HR 68 | Temp 97.6°F | Resp 14 | Wt 176.1 lb

## 2022-07-06 DIAGNOSIS — R1900 Intra-abdominal and pelvic swelling, mass and lump, unspecified site: Secondary | ICD-10-CM

## 2022-07-06 DIAGNOSIS — Z9079 Acquired absence of other genital organ(s): Secondary | ICD-10-CM

## 2022-07-06 DIAGNOSIS — N809 Endometriosis, unspecified: Secondary | ICD-10-CM

## 2022-07-06 DIAGNOSIS — Z90721 Acquired absence of ovaries, unilateral: Secondary | ICD-10-CM

## 2022-07-06 NOTE — Patient Instructions (Signed)
It was a pleasure to see you in clinic today. - Would encourage walking to build endurance prior to resuming full dance class. Avoid jumping/straining movements until 6 weeks after surgery. - Okay to resume routine Ob/Gyn care.  Thank you very much for allowing me to provide care for you today.  I appreciate your confidence in choosing our Gynecologic Oncology team at [facility].  If you have any questions about your visit today please call our office or send [place] a MyChart message and we will get back to you as soon as possible.

## 2022-07-06 NOTE — Progress Notes (Unsigned)
Gynecologic Oncology Return Clinic Visit  Date of Service: 07/06/2022 Referring Provider: Irene Pap, MD   Assessment & Plan: Laura Spencer is a 52 y.o. woman with bilateral hydrosalpinx with endometriosis, negative for malignancy who is 2 weeks s/p robotic-assisted left salpingo-oophorectomy, right salpingectomy on 06/23/22.  Postop: - Pt recovering well from surgery and healing appropriately postoperatively - Intraoperative findings and pathology results reviewed. - Ongoing postoperative expectations and precautions reviewed. Continue with no lifting >10lbs through 6 weeks postoperatively.  - Discussed what light activities with dance are appropriate through 6 weeks postop then okay to resume normal dance. - Given that uterus is in situ, pt advised that she should continue with pap smear screening per routine until age 29 if she continues with negative/low grade paps.   RTC as needed (return to routine Ob/Gyn Care).  Bernadene Bell, MD Gynecologic Oncology   Medical Decision Making I personally spent  TOTAL 20 minutes face-to-face and non-face-to-face in the care of this patient, which includes all pre, intra, and post visit time on the date of service.   ----------------------- Reason for Visit: Postop   Interval History: Pt reports that she is recovering well from surgery. She is using occasional tylenol for pain. She is eating and drinking well. She is voiding without issue and having regular bowel movements. Has had some light spotting. Reports one hotflash. She is eager to get back to dancing.   Past Medical/Surgical History: Past Medical History:  Diagnosis Date   Anemia    Anxiety    Attention deficit hyperactivity disorder    Complication of anesthesia    Depression    Hashimoto's disease    Headache    hx of migraines   Hypothyroidism    PONV (postoperative nausea and vomiting)    Vertigo 02/2022    Past Surgical History:  Procedure Laterality Date    CESAREAN SECTION     x2   COLONOSCOPY     ROBOTIC ASSISTED SALPINGO OOPHERECTOMY N/A 06/23/2022   Procedure: XI ROBOTIC ASSISTED RIGHT SALPINGO OOPHORECTOMY, LEFT SALPINGOECTOMY;  Surgeon: Bernadene Bell, MD;  Location: [facility];  Service: Gynecology;  Laterality: N/A;    Family History  Problem Relation Age of Onset   Breast cancer Neg Hx    Ovarian cancer Neg Hx    Colon cancer Neg Hx    Endometrial cancer Neg Hx     Social History   Socioeconomic History   Marital status: Married    Spouse name: Not on file   Number of children: Not on file   Years of education: Not on file   Highest education level: Not on file  Occupational History   Not on file  Tobacco Use   Smoking status: Never   Smokeless tobacco: Never  Vaping Use   Vaping Use: Never used  Substance and Sexual Activity   Alcohol use: Not Currently    Alcohol/week: 1.0 standard drink of alcohol    Types: 1 Standard drinks or equivalent per week    Comment: occasional once a week   Drug use: Not Currently    Comment: Gummy  with THC in it   Sexual activity: Yes    Partners: Male    Birth control/protection: I.U.D.    Comment: Copper IUD  Other Topics Concern   Not on file  Social History Narrative   Not on file   Social Determinants of Health   Financial Resource Strain: Not on file  Food Insecurity: Not on file  Transportation Needs: Not  on file  Physical Activity: Not on file  Stress: Not on file  Social Connections: Not on file    Current Medications:  Current Outpatient Medications:    cetirizine (ZYRTEC) 10 MG tablet, Take 10 mg by mouth daily., Disp: , Rfl:    ibuprofen (ADVIL) 800 MG tablet, Take 1 tablet (800 mg total) by mouth every 8 (eight) hours as needed for moderate pain. For AFTER surgery only, Disp: 30 tablet, Rfl: 0   meclizine (ANTIVERT) 25 MG tablet, Take 1 tablet (25 mg total) by mouth 3 (three) times daily as needed for dizziness., Disp: 30 tablet, Rfl: 0   MELATONIN PO,  Take 1 tablet by mouth at bedtime as needed (sleep)., Disp: , Rfl:    methylphenidate (RITALIN) 10 MG tablet, Take 10 mg by mouth daily., Disp: , Rfl:    methylphenidate 27 MG PO CR tablet, Take 27 mg by mouth daily., Disp: , Rfl:    Multiple Vitamin (MULTI-VITAMIN) tablet, Take 1 tablet by mouth daily., Disp: , Rfl:    SYNTHROID 125 MCG tablet, Take 125 mcg by mouth See admin instructions. Take 125 mcg by mouth Monday - Saturday, Disp: , Rfl:    ferrous sulfate 325 (65 FE) MG EC tablet, Take 325 mg by mouth daily. (Patient not taking: Reported on 07/02/2022), Disp: , Rfl:    ondansetron (ZOFRAN-ODT) 4 MG disintegrating tablet, Take 4 mg by mouth every 8 (eight) hours as needed for nausea or vomiting. (Patient not taking: Reported on 07/02/2022), Disp: , Rfl:   Review of Symptoms: Complete 10-system review is negative except as above in Interval History.  Physical Exam: BP 116/71 (BP Location: Right Arm, Patient Position: Sitting)   Pulse 68   Temp 97.6 F (36.4 C) (Oral)   Resp 14   Wt 176 lb 1.6 oz (79.9 kg)   LMP 03/29/2022 (Approximate)   SpO2 100%   BMI 28.42 kg/m  General: Alert, oriented, no acute distress. HEENT: Normocephalic, atraumatic. Neck symmetric without masses. Sclera anicteric.  Chest: Normal work of breathing. Clear to auscultation bilaterally.   Cardiovascular: Regular rate and rhythm, no murmurs. Abdomen: Soft, nontender.  Normoactive bowel sounds.  No masses or hepatosplenomegaly appreciated.  Well-healing incisions. Extremities: Grossly normal range of motion.  Warm, well perfused.  No edema bilaterally. Skin: No rashes or lesions noted.   Laboratory & Radiologic Studies: Surgical pathology (06/23/22): A. LEFT OVARY AND FALLOPIAN TUBE, SALPINGO OOPHORECTOMY:  Chronic salpingitis with hydrosalpinx  Focal luminal tubal endometriosis  Tubo-ovarian serosal adhesions  Benign ovary  Negative for malignancy   B. RIGHT FALLOPIAN TUBE, SALPINGECTOMY:  Mild chronic  salpingitis with mild hydrosalpinx  Focal endometriosis  Benign paratubal cyst  Negative for malignancy

## 2022-07-08 ENCOUNTER — Encounter: Payer: Self-pay | Admitting: Psychiatry

## 2022-07-13 ENCOUNTER — Encounter: Payer: Self-pay | Admitting: Family

## 2022-07-13 ENCOUNTER — Ambulatory Visit: Payer: BC Managed Care – PPO | Admitting: Family

## 2022-07-13 VITALS — BP 120/76 | HR 98 | Temp 98.6°F | Ht 66.0 in | Wt 176.2 lb

## 2022-07-13 DIAGNOSIS — E039 Hypothyroidism, unspecified: Secondary | ICD-10-CM

## 2022-07-13 DIAGNOSIS — R7989 Other specified abnormal findings of blood chemistry: Secondary | ICD-10-CM | POA: Diagnosis not present

## 2022-07-13 DIAGNOSIS — D509 Iron deficiency anemia, unspecified: Secondary | ICD-10-CM | POA: Diagnosis not present

## 2022-07-13 DIAGNOSIS — L858 Other specified epidermal thickening: Secondary | ICD-10-CM

## 2022-07-13 DIAGNOSIS — J3089 Other allergic rhinitis: Secondary | ICD-10-CM | POA: Diagnosis not present

## 2022-07-13 DIAGNOSIS — R42 Dizziness and giddiness: Secondary | ICD-10-CM | POA: Insufficient documentation

## 2022-07-13 MED ORDER — SYNTHROID 125 MCG PO TABS: 125.0000 ug | ORAL_TABLET | ORAL | 1 refills | Status: DC

## 2022-07-13 NOTE — Assessment & Plan Note (Signed)
Refill sent for synthroid125 mcg once daily  Repeat tsh and free t4 pending results.

## 2022-07-13 NOTE — Assessment & Plan Note (Signed)
Repeat cmp today pending results.

## 2022-07-13 NOTE — Assessment & Plan Note (Signed)
Order cbc pending results

## 2022-07-13 NOTE — Progress Notes (Signed)
New Patient Office Visit  Subjective:  Patient ID: Laura Spencer, female    DOB: 07/01/1970  Age: 52 y.o. MRN: [id number]  CC:  Chief Complaint  Patient presents with   Establish Care    HPI Laura Spencer is here to establish care as a new patient.  Prior provider was:Dr. Olevia Bowens  Pt is without acute concerns.   S/p bil salpingectomy with left oophorectomy for endometriosis.   [facility] [place] [facility], treated for ADHD. On ritalin 10 mg in afternoon as well as 27 mg once daily. With increased lack of focus since her last visit with him, and she has f/u scheduled with him.   chronic concerns:  Vertigo: three episodes since first one ever in 10/23, last episode was early december. She had experienced it right after a dance class.   She states she takes zyrtec nightly.   IDA: periods were heavy at the time, contributing. Recent finding of endometriosis. Recently had surgery bleeding has since improved.  Hypothyroid: taking levothyroxine 125 mcg once daily, takes six days out of seven days a week. Had been taking it daily rather than every six days.   Past Medical History:  Diagnosis Date   Anemia    Anxiety    Attention deficit hyperactivity disorder    Complication of anesthesia    Depression    Hashimoto's disease    Headache    hx of migraines   Hypothyroidism    PONV (postoperative nausea and vomiting)    Vertigo 02/2022    Past Surgical History:  Procedure Laterality Date   CESAREAN SECTION     x2   COLONOSCOPY     ROBOTIC ASSISTED SALPINGO OOPHERECTOMY N/A 06/23/2022   Procedure: XI ROBOTIC ASSISTED RIGHT SALPINGO OOPHORECTOMY, LEFT SALPINGOECTOMY;  Surgeon: Bernadene Bell, MD;  Location: [facility];  Service: Gynecology;  Laterality: N/A;    Family History  Problem Relation Age of Onset   Mental illness Mother    Depression Mother    Anxiety disorder Mother    Angina Father    Heart disease Father        bypass h/o   Breast cancer Neg Hx    Ovarian  cancer Neg Hx    Colon cancer Neg Hx    Endometrial cancer Neg Hx     Social History   Socioeconomic History   Marital status: Married    Spouse name: Not on file   Number of children: Not on file   Years of education: Not on file   Highest education level: Not on file  Occupational History   Not on file  Tobacco Use   Smoking status: Never   Smokeless tobacco: Never  Vaping Use   Vaping Use: Never used  Substance and Sexual Activity   Alcohol use: Not Currently    Alcohol/week: 1.0 standard drink of alcohol    Types: 1 Standard drinks or equivalent per week    Comment: occasional once a week   Drug use: Not Currently    Comment: Gummy  with THC in it   Sexual activity: Yes    Partners: Male    Birth control/protection: Surgical, I.U.D.    Comment: copper IUD in place will get removed  Other Topics Concern   Not on file  Social History Narrative   Not on file   Social Determinants of Health   Financial Resource Strain: Not on file  Food Insecurity: Not on file  Transportation Needs: Not on file  Physical  Activity: Not on file  Stress: Not on file  Social Connections: Not on file  Intimate Partner Violence: Not on file    Outpatient Medications Prior to Visit  Medication Sig Dispense Refill   cetirizine (ZYRTEC) 10 MG tablet Take 10 mg by mouth daily.     ferrous sulfate 325 (65 FE) MG EC tablet Take 325 mg by mouth daily.     ibuprofen (ADVIL) 800 MG tablet Take 1 tablet (800 mg total) by mouth every 8 (eight) hours as needed for moderate pain. For AFTER surgery only 30 tablet 0   meclizine (ANTIVERT) 25 MG tablet Take 1 tablet (25 mg total) by mouth 3 (three) times daily as needed for dizziness. 30 tablet 0   MELATONIN PO Take 1 tablet by mouth at bedtime as needed (sleep).     methylphenidate (RITALIN) 10 MG tablet Take 10 mg by mouth daily.     methylphenidate 27 MG PO CR tablet Take 27 mg by mouth daily.     Multiple Vitamin (MULTI-VITAMIN) tablet Take 1  tablet by mouth daily.     SYNTHROID 125 MCG tablet Take 125 mcg by mouth See admin instructions. Take 125 mcg by mouth Monday - Saturday     ondansetron (ZOFRAN-ODT) 4 MG disintegrating tablet Take 4 mg by mouth every 8 (eight) hours as needed for nausea or vomiting. (Patient not taking: Reported on 07/13/2022)     No facility-administered medications prior to visit.    Allergies  Allergen Reactions   Other Nausea Only    Anesthesia used during cesarean 20 years ago, pt unsure of name.     ROS Review of Systems  ROS: Pertinent symptoms negative unless otherwise noted in HPI      Objective:    Physical Exam Vitals reviewed.  Constitutional:      Appearance: Normal appearance. She is obese.  Eyes:     General:        Right eye: No discharge.        Left eye: No discharge.     Conjunctiva/sclera: Conjunctivae normal.  Cardiovascular:     Rate and Rhythm: Normal rate and regular rhythm.  Pulmonary:     Effort: Pulmonary effort is normal. No respiratory distress.  Musculoskeletal:        General: Normal range of motion.     Cervical back: Normal range of motion.  Skin:    General: Skin is warm.     Findings: Rash (small raised nontender scattered skin colored bumps on bil forearms and bil thighs anterior) present.  Neurological:     General: No focal deficit present.     Mental Status: She is alert and oriented to person, place, and time. Mental status is at baseline.  Psychiatric:        Mood and Affect: Mood normal.        Behavior: Behavior normal.        Thought Content: Thought content normal.        Judgment: Judgment normal.       BP 120/76   Pulse 98   Temp 98.6 F (37 C) (Oral)   Ht '5\' 6"'$  (1.676 m)   Wt 176 lb 3.2 oz (79.9 kg)   LMP 03/29/2022 (Approximate)   SpO2 99%   BMI 28.44 kg/m  Wt Readings from Last 3 Encounters:  07/13/22 176 lb 3.2 oz (79.9 kg)  07/06/22 176 lb 1.6 oz (79.9 kg)  06/23/22 174 lb 2.6 oz (79 kg)  There are no  preventive care reminders to display for this patient.  There are no preventive care reminders to display for this patient.  No results found for: "TSH" Lab Results  Component Value Date   WBC 5.8 06/22/2022   HGB 12.8 06/22/2022   HCT 39.1 06/22/2022   MCV 90.7 06/22/2022   PLT 236 06/22/2022   Lab Results  Component Value Date   NA 139 06/22/2022   K 4.0 06/22/2022   CO2 27 06/22/2022   GLUCOSE 82 06/22/2022   BUN 17 06/22/2022   CREATININE 0.92 06/22/2022   BILITOT 0.7 06/22/2022   ALKPHOS 63 06/22/2022   AST 32 06/22/2022   ALT 65 (H) 06/22/2022   PROT 6.7 06/22/2022   ALBUMIN 3.4 (L) 06/22/2022   CALCIUM 9.0 06/22/2022   ANIONGAP 8 06/22/2022   No results found for: "CHOL" No results found for: "HDL" No results found for: "LDLCALC" No results found for: "TRIG" No results found for: "CHOLHDL" No results found for: "HGBA1C"    Assessment & Plan:   Problem List Items Addressed This Visit       Respiratory   Non-seasonal allergic rhinitis - Primary    Recommend daily zyrtec and start flonase may help with vertigo as well.  Meclizine prn        Endocrine   Hypothyroidism (Chronic)    Refill sent for synthroid125 mcg once daily  Repeat tsh and free t4 pending results.      Relevant Medications   SYNTHROID 125 MCG tablet   Other Relevant Orders   TSH   T4, free     Musculoskeletal and Integument   Keratosis pilaris     Other   Iron deficiency anemia    Order cbc pending results      Relevant Orders   CBC   Elevated liver function tests    Repeat cmp today pending results.      Relevant Orders   Comprehensive metabolic panel   Vertigo    Suspect worsening with uncontrolled allergic rhinitis Continue zyrtec start flonase. Meclizine prn       Meds ordered this encounter  Medications   SYNTHROID 125 MCG tablet    Sig: Take 1 tablet (125 mcg total) by mouth See admin instructions. Take 125 mcg by mouth Monday - Saturday    Dispense:   90 tablet    Refill:  1    Follow-up: Return in about 6 months (around 01/11/2023) for f/u thyroid .    Eugenia Pancoast, FNP

## 2022-07-13 NOTE — Assessment & Plan Note (Signed)
Recommend daily zyrtec and start flonase may help with vertigo as well.  Meclizine prn

## 2022-07-13 NOTE — Assessment & Plan Note (Signed)
Suspect worsening with uncontrolled allergic rhinitis Continue zyrtec start flonase. Meclizine prn

## 2022-07-13 NOTE — Patient Instructions (Addendum)
  Recommend over the counter zyrtec and also flonase.   Welcome to our clinic, I am happy to have you as my new patient. I am excited to continue on this healthcare journey with you.  Stop by the lab prior to leaving today. I will notify you of your results once received.   ------------------------------------  Stop by the lab prior to leaving today. I will notify you of your results once received.   Please keep in mind Any my chart messages you send have up to a three business day turnaround for a response.  Phone calls may take up to a one full business day turnaround for a  response.   If you need a medication refill I recommend you request it through the pharmacy as this is easiest for [place] rather than sending a message and or phone call.   Due to recent changes in healthcare laws, you may see results of your imaging and/or laboratory studies on MyChart before I have had a chance to review them.  I understand that in some cases there may be results that are confusing or concerning to you. Please understand that not all results are received at the same time and often I may need to interpret multiple results in order to provide you with the best plan of care or course of treatment. Therefore, I ask that you please give me 2 business days to thoroughly review all your results before contacting my office for clarification. Should we see a critical lab result, you will be contacted sooner.   It was a pleasure seeing you today! Please do not hesitate to reach out with any questions and or concerns.  Regards,   Eugenia Pancoast FNP-C

## 2022-07-14 ENCOUNTER — Other Ambulatory Visit: Payer: Self-pay | Admitting: Family

## 2022-07-14 DIAGNOSIS — L858 Other specified epidermal thickening: Secondary | ICD-10-CM

## 2022-07-14 DIAGNOSIS — R7989 Other specified abnormal findings of blood chemistry: Secondary | ICD-10-CM

## 2022-07-14 DIAGNOSIS — E039 Hypothyroidism, unspecified: Secondary | ICD-10-CM

## 2022-07-14 LAB — CBC
HCT: 39.4 % (ref 36.0–46.0)
Hemoglobin: 13.5 g/dL (ref 12.0–15.0)
MCHC: 34.4 g/dL (ref 30.0–36.0)
MCV: 87.9 fl (ref 78.0–100.0)
Platelets: 278 10*3/uL (ref 150.0–400.0)
RBC: 4.48 Mil/uL (ref 3.87–5.11)
RDW: 13.3 % (ref 11.5–15.5)
WBC: 6 10*3/uL (ref 4.0–10.5)

## 2022-07-14 LAB — COMPREHENSIVE METABOLIC PANEL
ALT: 96 U/L — ABNORMAL HIGH (ref 0–35)
AST: 41 U/L — ABNORMAL HIGH (ref 0–37)
Albumin: 4.2 g/dL (ref 3.5–5.2)
Alkaline Phosphatase: 87 U/L (ref 39–117)
BUN: 23 mg/dL (ref 6–23)
CO2: 29 mEq/L (ref 19–32)
Calcium: 9.4 mg/dL (ref 8.4–10.5)
Chloride: 101 mEq/L (ref 96–112)
Creatinine, Ser: 0.75 mg/dL (ref 0.40–1.20)
GFR: 92.04 mL/min (ref >60.00)
Glucose, Bld: 79 mg/dL (ref 70–99)
Potassium: 4.3 mEq/L (ref 3.5–5.1)
Sodium: 137 mEq/L (ref 135–145)
Total Bilirubin: 0.4 mg/dL (ref 0.2–1.2)
Total Protein: 7.3 g/dL (ref 6.0–8.3)

## 2022-07-14 LAB — T4, FREE: Free T4: 1.24 ng/dL (ref 0.60–1.60)

## 2022-07-14 LAB — TSH: TSH: 0.16 u[IU]/mL — ABNORMAL LOW (ref 0.35–5.50)

## 2022-07-14 NOTE — Progress Notes (Signed)
Any chance we can add hepatitis panel?

## 2022-07-14 NOTE — Progress Notes (Signed)
Can we ask pt to come in for hepatitis panel (lab work) I want to add it on since her liver function was elevated.

## 2022-07-14 NOTE — Progress Notes (Signed)
Thyroid overly suppressed as suspected. Go back to taking six days out of the week, off the seventh, and repeat tsh with lab only apt in four weeks.   Liver function increased. Your liver function tests have continued to increase. Are you drinking any alcohol? Do you take tylenol and or ibuprofen on a regular basis? Do you drink herbal teas and or take herbal supplements?

## 2022-07-15 DIAGNOSIS — F4322 Adjustment disorder with anxiety: Secondary | ICD-10-CM | POA: Diagnosis not present

## 2022-07-16 ENCOUNTER — Other Ambulatory Visit (INDEPENDENT_AMBULATORY_CARE_PROVIDER_SITE_OTHER): Payer: BC Managed Care – PPO

## 2022-07-16 DIAGNOSIS — R7989 Other specified abnormal findings of blood chemistry: Secondary | ICD-10-CM

## 2022-07-17 LAB — HEPATITIS PANEL, ACUTE
Hep A IgM: NONREACTIVE
Hep B C IgM: NONREACTIVE
Hepatitis B Surface Ag: NONREACTIVE
Hepatitis C Ab: NONREACTIVE

## 2022-07-24 ENCOUNTER — Encounter: Payer: Self-pay | Admitting: Family

## 2022-07-24 DIAGNOSIS — R42 Dizziness and giddiness: Secondary | ICD-10-CM

## 2022-07-24 NOTE — Telephone Encounter (Signed)
Do you need to see in office or ok to do referral

## 2022-07-28 DIAGNOSIS — F4322 Adjustment disorder with anxiety: Secondary | ICD-10-CM | POA: Diagnosis not present

## 2022-08-04 NOTE — Addendum Note (Signed)
Addended by: Eugenia Pancoast on: 08/04/2022 02:53 PM   Modules accepted: Orders

## 2022-08-04 NOTE — Telephone Encounter (Signed)
Referral placed for physical therapy as requested.

## 2022-08-04 NOTE — Telephone Encounter (Signed)
Spoke with patient and advised the above. She has scheduled a PT appt with [facility] for tomorrow.

## 2022-08-05 DIAGNOSIS — R42 Dizziness and giddiness: Secondary | ICD-10-CM | POA: Diagnosis not present

## 2022-08-12 ENCOUNTER — Other Ambulatory Visit (INDEPENDENT_AMBULATORY_CARE_PROVIDER_SITE_OTHER): Payer: BC Managed Care – PPO

## 2022-08-12 DIAGNOSIS — E039 Hypothyroidism, unspecified: Secondary | ICD-10-CM | POA: Diagnosis not present

## 2022-08-12 LAB — TSH: TSH: 0.58 u[IU]/mL (ref 0.35–5.50)

## 2022-08-18 ENCOUNTER — Telehealth: Payer: Self-pay | Admitting: Family

## 2022-08-18 DIAGNOSIS — E039 Hypothyroidism, unspecified: Secondary | ICD-10-CM

## 2022-08-18 DIAGNOSIS — F4322 Adjustment disorder with anxiety: Secondary | ICD-10-CM | POA: Diagnosis not present

## 2022-08-18 NOTE — Telephone Encounter (Signed)
Prescription Request  08/18/2022  LOV: 07/13/2022  What is the name of the medication or equipment? SYNTHROID 125 MCG tablet   Have you contacted your pharmacy to request a refill? Yes, she stated that the medication went to the wrong pharmacy the first time.   Which pharmacy would you like this sent to? [place] [facility], [place] AT [id number] [place] [address] Phone: [phone] Fax: [phone]    Patient notified that their request is being sent to the clinical staff for review and that they should receive a response within 2 business days.   Please advise at Mobile [phone] (mobile)

## 2022-08-20 MED ORDER — SYNTHROID 125 MCG PO TABS: 125.0000 ug | ORAL_TABLET | ORAL | 1 refills | Status: DC

## 2022-08-20 NOTE — Telephone Encounter (Addendum)
Sent medication to correct pharmacy. Mychart msg sent.

## 2022-08-20 NOTE — Addendum Note (Signed)
Addended by: Vaughan Browner on: 08/20/2022 10:52 AM   Modules accepted: Orders

## 2022-08-25 ENCOUNTER — Encounter: Payer: Self-pay | Admitting: Family

## 2022-08-25 ENCOUNTER — Ambulatory Visit: Payer: BC Managed Care – PPO | Admitting: Family

## 2022-08-25 VITALS — BP 106/74 | HR 78 | Temp 97.8°F | Ht 66.0 in | Wt 176.2 lb

## 2022-08-25 DIAGNOSIS — H938X2 Other specified disorders of left ear: Secondary | ICD-10-CM | POA: Diagnosis not present

## 2022-08-25 DIAGNOSIS — J020 Streptococcal pharyngitis: Secondary | ICD-10-CM | POA: Diagnosis not present

## 2022-08-25 DIAGNOSIS — J029 Acute pharyngitis, unspecified: Secondary | ICD-10-CM | POA: Insufficient documentation

## 2022-08-25 LAB — POCT RAPID STREP A (OFFICE): Rapid Strep A Screen: POSITIVE — AB

## 2022-08-25 IMAGING — MG MM DIGITAL SCREENING BILAT W/ TOMO AND CAD
8 series · 8 of 24 positions shown · non-contrast
Comparison: Previous exam(s).

CLINICAL DATA: Screening.

EXAM:
DIGITAL SCREENING BILATERAL MAMMOGRAM WITH TOMOSYNTHESIS AND CAD
TECHNIQUE: Bilateral screening digital craniocaudal and mediolateral oblique
mammograms were obtained. Bilateral screening digital breast
tomosynthesis was performed. The images were evaluated with
computer-aided detection.

[R CC synth-2D]
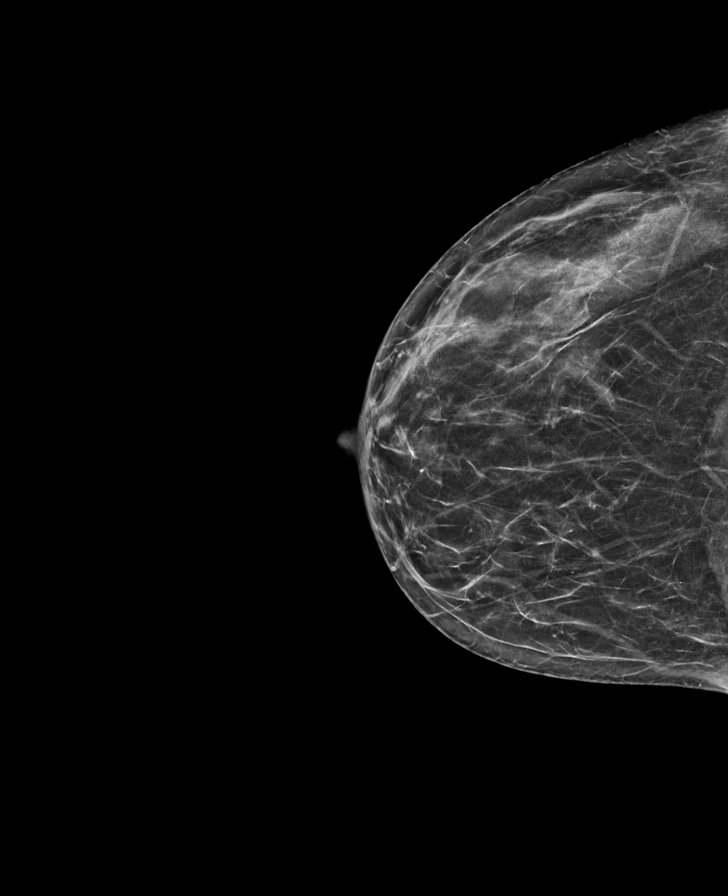

[L MLO synth-2D]
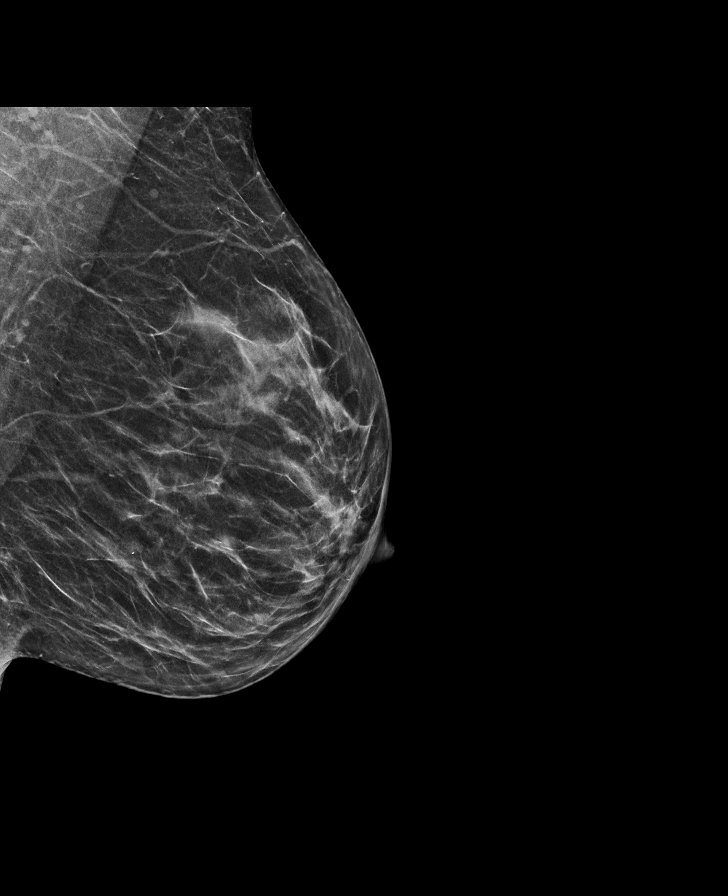

[L CC synth-2D]
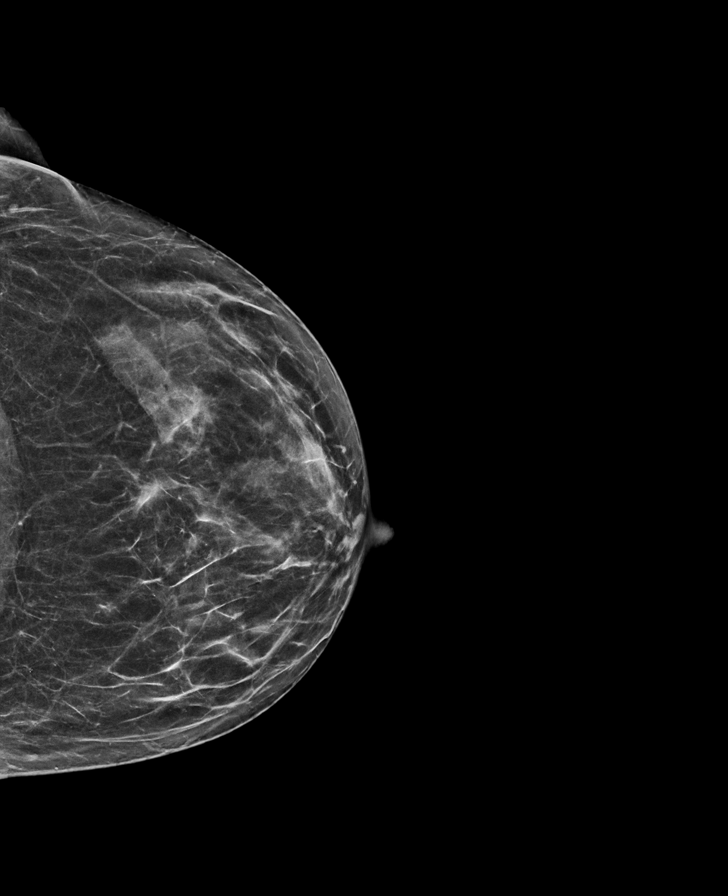

[R MLO synth-2D]
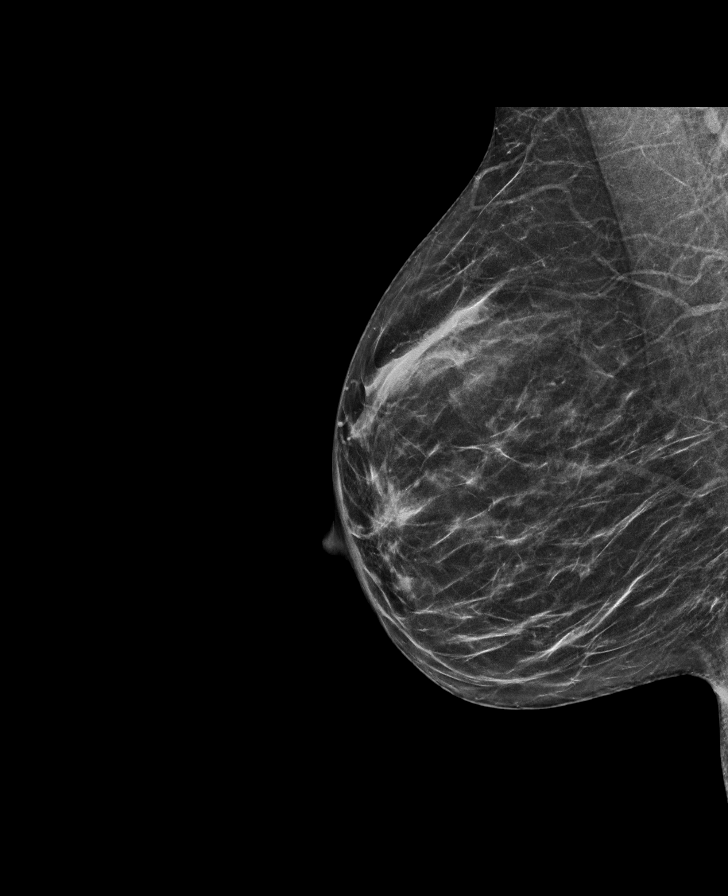

[R MLO tomo · tomo slice 29/56.0]
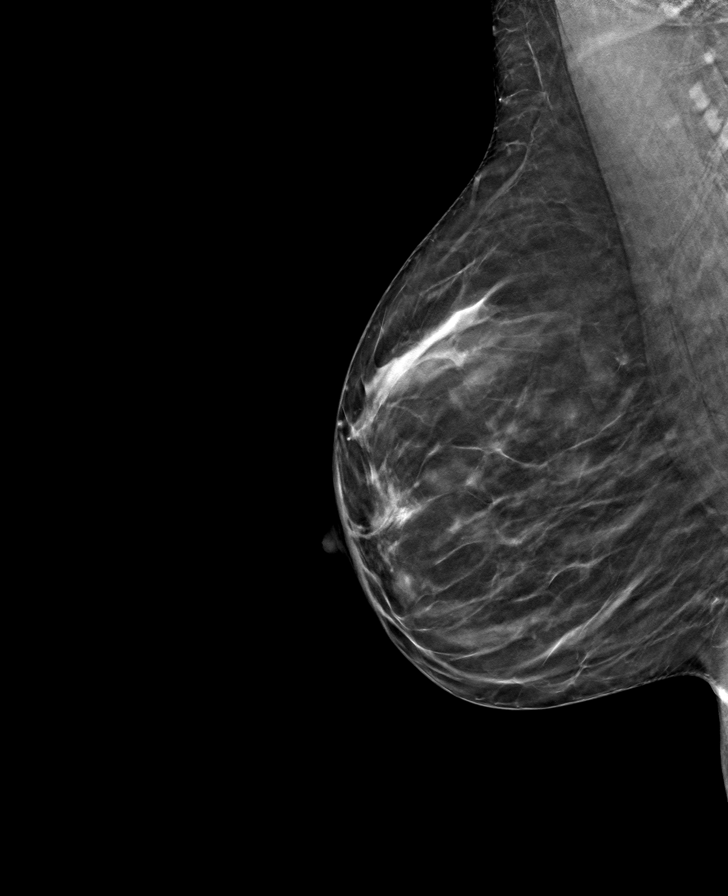

[R CC tomo · tomo slice 29/57.0]
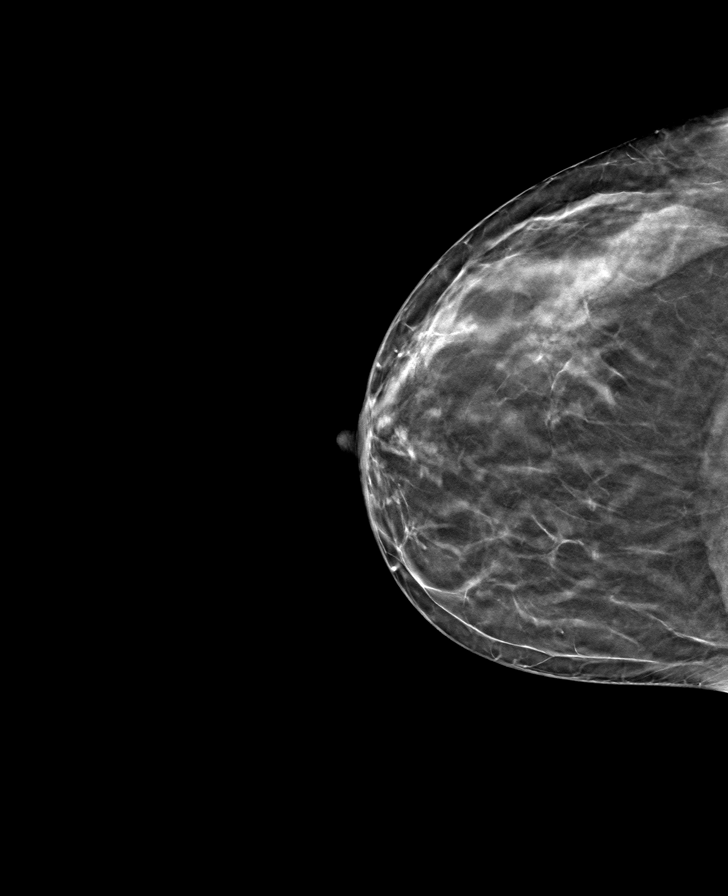

[L CC tomo · tomo slice 29/58.0]
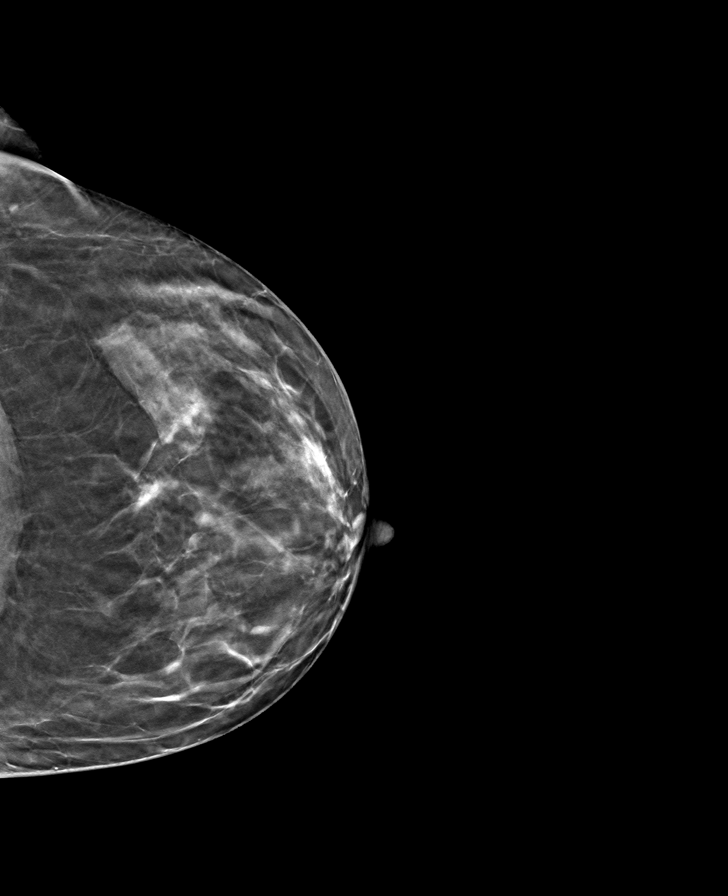

[L MLO tomo · tomo slice 29/58.0]
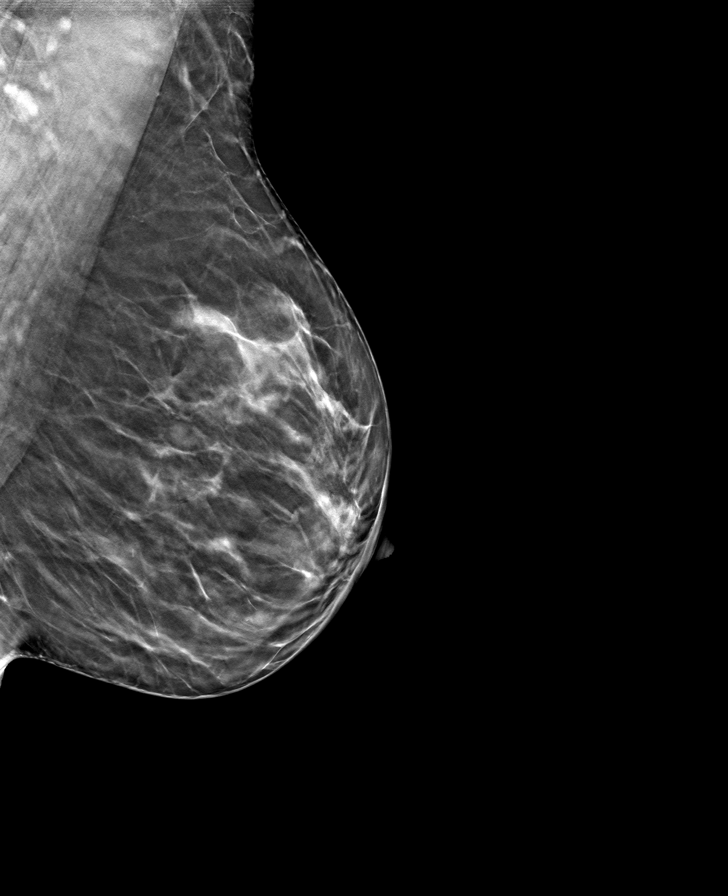

[8 of 24 positions shown; findings below may reference images not displayed]

ACR Breast Density Category b: There are scattered areas of
fibroglandular density.
FINDINGS: There are no findings suspicious for malignancy.
IMPRESSION: No mammographic evidence of malignancy. A result letter of this
screening mammogram will be mailed directly to the patient.

RECOMMENDATION:
Screening mammogram in one year. (Code:51-O-LD2)

BI-RADS CATEGORY  1: Negative.

## 2022-08-25 MED ORDER — AMOXICILLIN 500 MG PO CAPS: 500.0000 mg | ORAL_CAPSULE | Freq: Two times a day (BID) | ORAL | 0 refills | Status: AC

## 2022-08-25 NOTE — Patient Instructions (Signed)
------------------------------------    You were found to be strep positive,  Take antibiotics that have been sent to the pharmacy.  Change your toothbrush after 24 hours on the antibiotics.  Gargle with warm salt water as needed for sore throat.   ------------------------------------  

## 2022-08-25 NOTE — Progress Notes (Signed)
Established Patient Office Visit  Subjective:   Patient ID: Laura Spencer, female    DOB: [birth year]  Age: 52 y.o. MRN: [id number]  CC:  Chief Complaint  Patient presents with   Ear Fullness    Left side    HPI: Laura Spencer is a 52 y.o. female presenting today for left ear fullness.  Patient went traveling to [place] about 2 weeks ago. Patient reports that when she returned she had ear fullness in her left ear. She thought that it may be due to difference in air pressure due to the plane ride but it has just continued. Patient stated that some in the ear "pops". Reports fullness has not gotten worse or better just has stayed the same. Denies pain and drainage from ear. Denies sinus pressure, cough, chest congestion, sore throat, fever, and chills. Patient does take daily Allegra and Flonase for allergies. She takes Allegra consistently but not the Flonase.     ROS: Negative unless specifically indicated above in HPI.   Relevant past medical history reviewed and updated as indicated.   Allergies and medications reviewed and updated.   Current Outpatient Medications:    amoxicillin (AMOXIL) 500 MG capsule, Take 1 capsule (500 mg total) by mouth 2 (two) times daily for 10 days., Disp: 20 capsule, Rfl: 0   ferrous sulfate 325 (65 FE) MG EC tablet, Take 325 mg by mouth daily., Disp: , Rfl:    ibuprofen (ADVIL) 800 MG tablet, Take 1 tablet (800 mg total) by mouth every 8 (eight) hours as needed for moderate pain. For AFTER surgery only, Disp: 30 tablet, Rfl: 0   meclizine (ANTIVERT) 25 MG tablet, Take 1 tablet (25 mg total) by mouth 3 (three) times daily as needed for dizziness., Disp: 30 tablet, Rfl: 0   MELATONIN PO, Take 1 tablet by mouth at bedtime as needed (sleep)., Disp: , Rfl:    methylphenidate (RITALIN) 10 MG tablet, Take 10 mg by mouth daily., Disp: , Rfl:    methylphenidate 27 MG PO CR tablet, Take 27 mg by mouth daily., Disp: , Rfl:    Multiple Vitamin  (MULTI-VITAMIN) tablet, Take 1 tablet by mouth daily., Disp: , Rfl:    SYNTHROID 125 MCG tablet, Take 1 tablet (125 mcg total) by mouth See admin instructions. Take 125 mcg by mouth Monday - Saturday, Disp: 90 tablet, Rfl: 1  Allergies  Allergen Reactions   Other Nausea Only    Anesthesia used during cesarean 20 years ago, pt unsure of name.     Objective:   BP 106/74   Pulse 78   Temp 97.8 F (36.6 C) (Temporal)   Ht '5\' 6"'$  (1.676 m)   Wt 176 lb 3.2 oz (79.9 kg)   SpO2 99%   BMI 28.44 kg/m    Physical Exam Vitals and nursing note reviewed.  Constitutional:      General: She is not in acute distress.    Appearance: Normal appearance. She is obese. She is not ill-appearing or toxic-appearing.  HENT:     Head: Normocephalic.     Right Ear: Tympanic membrane, ear canal and external ear normal. No drainage, swelling or tenderness. No middle ear effusion.     Left Ear: Tympanic membrane, ear canal and external ear normal. No drainage, swelling or tenderness.  No middle ear effusion.     Nose: Nose normal.     Right Sinus: No maxillary sinus tenderness or frontal sinus tenderness.     Left Sinus: No maxillary sinus  tenderness or frontal sinus tenderness.     Mouth/Throat:     Mouth: Mucous membranes are dry.     Pharynx: Posterior oropharyngeal erythema present. No pharyngeal swelling or oropharyngeal exudate.     Tonsils: No tonsillar exudate.  Eyes:     Extraocular Movements: Extraocular movements intact.     Conjunctiva/sclera: Conjunctivae normal.     Pupils: Pupils are equal, round, and reactive to light.  Cardiovascular:     Rate and Rhythm: Normal rate and regular rhythm.     Pulses: Normal pulses.     Heart sounds: Normal heart sounds.  Pulmonary:     Effort: Pulmonary effort is normal.     Breath sounds: Normal breath sounds.  Skin:    General: Skin is warm and dry.  Neurological:     General: No focal deficit present.     Mental Status: She is alert and oriented  to person, place, and time.  Psychiatric:        Mood and Affect: Mood normal.        Behavior: Behavior normal.        Thought Content: Thought content normal.        Judgment: Judgment normal.     Assessment & Plan:  Sore throat Assessment & Plan: Rapid strep positive. Rx sent to patient's pharmacy for Amoxicillin '500mg'$  BID for 10 days. Advised patient to take OTC Tylenol or Ibuprofen as needed for pain. If symptoms do not improve/worsen or if fever/chills develop patient is to notify provider.   Orders: -     POCT rapid strep A  Strep pharyngitis Assessment & Plan: Rx sent to patient's pharmacy for Amoxicillin '500mg'$  BID for 10 days. Advised patient to take OTC Tylenol or Ibuprofen as needed for pain. If symptoms do not improve/worsen or if fever/chills develop patient is to notify provider.   Orders: -     Amoxicillin; Take 1 capsule (500 mg total) by mouth 2 (two) times daily for 10 days.  Dispense: 20 capsule; Refill: 0  Fullness in ear, left Assessment & Plan: Advised patient to continue to take Allegra daily and to start taking  Flonase nose spray daily.   I evaluated the patient,  was consulted regarding plans for treatment of care, and agree with the assessment and plan per Joya Gaskins, RN, DNP student.  Eugenia Pancoast, FNP-C      Follow up plan: Return for f/u with primary care provider if no improvement.  Eugenia Pancoast, FNP AGNP-student

## 2022-08-25 NOTE — Assessment & Plan Note (Addendum)
Rx sent to patient's pharmacy for Amoxicillin '500mg'$  BID for 10 days. Advised patient to take OTC Tylenol or Ibuprofen as needed for pain. If symptoms do not improve/worsen or if fever/chills develop patient is to notify provider.

## 2022-08-25 NOTE — Assessment & Plan Note (Addendum)
Advised patient to continue to take Allegra daily and to start taking  Flonase nose spray daily.   I evaluated the patient,  was consulted regarding plans for treatment of care, and agree with the assessment and plan per Joya Gaskins, RN, DNP student.  -Eugenia Pancoast, FNP-C

## 2022-08-25 NOTE — Progress Notes (Signed)
Established Patient Office Visit  Subjective:   Patient ID: Laura Spencer, female    DOB: 1971-02-22  Age: 52 y.o. MRN: [id number]  CC:  Chief Complaint  Patient presents with   Ear Fullness    Left side    HPI: Laura Spencer is a 52 y.o. female presenting on 08/25/2022 for Ear Fullness (Left side)     Ear Fullness     Recently came back from trip to [place].  Two weeks ago started with nasal congestion, bil ear fullness and popping  Denies cough or chest congestion. No fever or chills. No sore throat. No sinus pressure.   Symptoms have seemed to stay the same, not worsening but not improving.  Does take daily allegra but already takes this.  Did get some flonase nose spray but hasn't yet started to take.   She was recently exposed to strep.      ROS: Negative unless specifically indicated above in HPI.   Relevant past medical history reviewed and updated as indicated.   Allergies and medications reviewed and updated.   Current Outpatient Medications:    amoxicillin (AMOXIL) 500 MG capsule, Take 1 capsule (500 mg total) by mouth 2 (two) times daily for 10 days., Disp: 20 capsule, Rfl: 0   ferrous sulfate 325 (65 FE) MG EC tablet, Take 325 mg by mouth daily., Disp: , Rfl:    ibuprofen (ADVIL) 800 MG tablet, Take 1 tablet (800 mg total) by mouth every 8 (eight) hours as needed for moderate pain. For AFTER surgery only, Disp: 30 tablet, Rfl: 0   meclizine (ANTIVERT) 25 MG tablet, Take 1 tablet (25 mg total) by mouth 3 (three) times daily as needed for dizziness., Disp: 30 tablet, Rfl: 0   MELATONIN PO, Take 1 tablet by mouth at bedtime as needed (sleep)., Disp: , Rfl:    methylphenidate (RITALIN) 10 MG tablet, Take 10 mg by mouth daily., Disp: , Rfl:    methylphenidate 27 MG PO CR tablet, Take 27 mg by mouth daily., Disp: , Rfl:    Multiple Vitamin (MULTI-VITAMIN) tablet, Take 1 tablet by mouth daily., Disp: , Rfl:    SYNTHROID 125 MCG tablet, Take 1 tablet (125 mcg  total) by mouth See admin instructions. Take 125 mcg by mouth Monday - Saturday, Disp: 90 tablet, Rfl: 1  Allergies  Allergen Reactions   Other Nausea Only    Anesthesia used during cesarean 20 years ago, pt unsure of name.     Objective:   BP 106/74   Pulse 78   Temp 97.8 F (36.6 C) (Temporal)   Ht '5\' 6"'$  (1.676 m)   Wt 176 lb 3.2 oz (79.9 kg)   SpO2 99%   BMI 28.44 kg/m    Physical Exam Constitutional:      General: She is not in acute distress.    Appearance: Normal appearance. She is normal weight. She is not ill-appearing, toxic-appearing or diaphoretic.  HENT:     Head: Normocephalic.     Right Ear: Tympanic membrane normal.     Left Ear: Tympanic membrane normal.     Nose: Nose normal.     Mouth/Throat:     Mouth: Mucous membranes are dry.     Pharynx: Posterior oropharyngeal erythema present. No oropharyngeal exudate.  Eyes:     Extraocular Movements: Extraocular movements intact.     Pupils: Pupils are equal, round, and reactive to light.  Cardiovascular:     Rate and Rhythm: Normal rate and regular rhythm.  Pulses: Normal pulses.     Heart sounds: Normal heart sounds.  Pulmonary:     Effort: Pulmonary effort is normal.     Breath sounds: Normal breath sounds.  Musculoskeletal:     Cervical back: Normal range of motion.  Neurological:     General: No focal deficit present.     Mental Status: She is alert and oriented to person, place, and time. Mental status is at baseline.  Psychiatric:        Mood and Affect: Mood normal.        Behavior: Behavior normal.        Thought Content: Thought content normal.        Judgment: Judgment normal.     Assessment & Plan:  Sore throat Assessment & Plan: Rapid strep positive. Rx sent to patient's pharmacy for Amoxicillin '500mg'$  BID for 10 days. Advised patient to take OTC Tylenol or Ibuprofen as needed for pain. If symptoms do not improve/worsen or if fever/chills develop patient is to notify provider.    Orders: -     POCT rapid strep A  Strep pharyngitis Assessment & Plan: Rx sent to patient's pharmacy for Amoxicillin '500mg'$  BID for 10 days. Advised patient to take OTC Tylenol or Ibuprofen as needed for pain. If symptoms do not improve/worsen or if fever/chills develop patient is to notify provider.   Orders: -     Amoxicillin; Take 1 capsule (500 mg total) by mouth 2 (two) times daily for 10 days.  Dispense: 20 capsule; Refill: 0  Fullness in ear, left Assessment & Plan: Advised patient to continue to take Allegra daily and to start taking  Flonase nose spray daily.   I evaluated the patient,  was consulted regarding plans for treatment of care, and agree with the assessment and plan per Joya Gaskins, RN, DNP student.  Eugenia Pancoast, FNP-C       Follow up plan: Return for f/u with primary care provider if no improvement.  Eugenia Pancoast, FNP

## 2022-08-25 NOTE — Assessment & Plan Note (Signed)
Rapid strep positive. Rx sent to patient's pharmacy for Amoxicillin '500mg'$  BID for 10 days. Advised patient to take OTC Tylenol or Ibuprofen as needed for pain. If symptoms do not improve/worsen or if fever/chills develop patient is to notify provider.

## 2022-08-28 DIAGNOSIS — F411 Generalized anxiety disorder: Secondary | ICD-10-CM | POA: Diagnosis not present

## 2022-08-28 DIAGNOSIS — F4322 Adjustment disorder with anxiety: Secondary | ICD-10-CM | POA: Diagnosis not present

## 2022-08-28 DIAGNOSIS — F419 Anxiety disorder, unspecified: Secondary | ICD-10-CM | POA: Diagnosis not present

## 2022-08-28 DIAGNOSIS — F331 Major depressive disorder, recurrent, moderate: Secondary | ICD-10-CM | POA: Diagnosis not present

## 2022-09-01 DIAGNOSIS — F4322 Adjustment disorder with anxiety: Secondary | ICD-10-CM | POA: Diagnosis not present

## 2022-09-15 DIAGNOSIS — F4322 Adjustment disorder with anxiety: Secondary | ICD-10-CM | POA: Diagnosis not present

## 2022-09-25 DIAGNOSIS — F331 Major depressive disorder, recurrent, moderate: Secondary | ICD-10-CM | POA: Diagnosis not present

## 2022-09-25 DIAGNOSIS — F411 Generalized anxiety disorder: Secondary | ICD-10-CM | POA: Diagnosis not present

## 2022-09-25 DIAGNOSIS — F902 Attention-deficit hyperactivity disorder, combined type: Secondary | ICD-10-CM | POA: Diagnosis not present

## 2022-09-25 DIAGNOSIS — F4322 Adjustment disorder with anxiety: Secondary | ICD-10-CM | POA: Diagnosis not present

## 2022-10-06 DIAGNOSIS — F4322 Adjustment disorder with anxiety: Secondary | ICD-10-CM | POA: Diagnosis not present

## 2022-10-07 ENCOUNTER — Encounter: Payer: Self-pay | Admitting: *Deleted

## 2022-10-07 NOTE — Telephone Encounter (Signed)
FYI, [facility] has been trying to reach the patient and she has not returned calls to schedule her PT/Vestibular appt.   I have sent her a Mychart asking her to give them a call.   They have closed the referral for now since they were unable to reach her. The referral can be reopened when she calls to schedule.

## 2022-10-07 NOTE — Telephone Encounter (Signed)
Thanks Ashtyn for the update.

## 2022-10-08 ENCOUNTER — Ambulatory Visit (INDEPENDENT_AMBULATORY_CARE_PROVIDER_SITE_OTHER): Payer: BLUE CROSS/BLUE SHIELD | Admitting: Family

## 2022-10-08 ENCOUNTER — Encounter: Payer: Self-pay | Admitting: Family

## 2022-10-08 VITALS — BP 118/72 | HR 90 | Temp 98.0°F | Ht 66.0 in | Wt 177.4 lb

## 2022-10-08 DIAGNOSIS — M21612 Bunion of left foot: Secondary | ICD-10-CM

## 2022-10-08 DIAGNOSIS — M722 Plantar fascial fibromatosis: Secondary | ICD-10-CM

## 2022-10-08 DIAGNOSIS — R6889 Other general symptoms and signs: Secondary | ICD-10-CM

## 2022-10-08 DIAGNOSIS — M21611 Bunion of right foot: Secondary | ICD-10-CM

## 2022-10-08 DIAGNOSIS — R7989 Other specified abnormal findings of blood chemistry: Secondary | ICD-10-CM | POA: Insufficient documentation

## 2022-10-08 LAB — HEPATIC FUNCTION PANEL
ALT: 55 U/L — ABNORMAL HIGH (ref 0–35)
AST: 34 U/L (ref 0–37)
Albumin: 4.1 g/dL (ref 3.5–5.2)
Alkaline Phosphatase: 69 U/L (ref 39–117)
Bilirubin, Direct: 0.1 mg/dL (ref 0.0–0.3)
Total Bilirubin: 0.6 mg/dL (ref 0.2–1.2)
Total Protein: 7 g/dL (ref 6.0–8.3)

## 2022-10-08 LAB — TSH: TSH: 4.44 u[IU]/mL (ref 0.35–5.50)

## 2022-10-08 NOTE — Patient Instructions (Signed)
A referral was placed today for podiatry.  Please let us know if you have not heard back within 2 weeks about the referral.   Regards,   Tajon Moring FNP-C  

## 2022-10-08 NOTE — Assessment & Plan Note (Signed)
Along with heat intolerance will recheck tsh pending results

## 2022-10-08 NOTE — Assessment & Plan Note (Signed)
Recommendation for bunion pads  Referral podiatry for eval/treat

## 2022-10-08 NOTE — Assessment & Plan Note (Signed)
New, worsening Advised of stretching exercises  Print out given to pt on this as well  Referral podiatry for eval/treat per pt request Recommended supportive shoes, inserts, as well as arch compression sleeves  Nsaids prn  Rest

## 2022-10-08 NOTE — Progress Notes (Signed)
Established Patient Office Visit  Subjective:      CC:  Chief Complaint  Patient presents with   Pain    Having bilateral foot and toe pain for a couple of months.    Hot Flashes    Comes and goes. Concerns with weight gain/menopause    HPI: Laura Spencer is a 52 y.o. female presenting on 10/08/2022 for Pain (Having bilateral foot and toe pain for a couple of months. ) and Hot Flashes (Comes and goes. Concerns with weight gain/menopause) . Over the last few months with foot pain, she describes at times pain in the toes and or the balls of the feet. She does carry a lot of tension in her feet, and feeling more so in her soul. She does also feel some growth on the sides of her great toe.  Especially after going out dancing.  She did see a massage therapist who works on pressure points with her, and she is going to try this out further.   Hot flashes, concerns with weight gain with menopause.  Decreased libido.  However she does state had some manipulative therapy with her massage therapist yesterday which she states has increased her libido as well as helped with her anxiety. They are also working on her concentration efforts for ADD which she states have been improving some.   Hypothyroid: recent change back in January for taking levothyroxine six days a week and has improved tsh slightly.  She does report increased brain fog though with heat intolerance.   Elevated lfts, did cut back on the tylenol since last lab draw.  No ruq abdominal pain.        Social history:  Relevant past medical, surgical, family and social history reviewed and updated as indicated. Interim medical history since our last visit reviewed.  Allergies and medications reviewed and updated.  DATA REVIEWED: CHART IN EPIC     ROS: Negative unless specifically indicated above in HPI.    Current Outpatient Medications:    ferrous sulfate 325 (65 FE) MG EC tablet, Take 325 mg by mouth daily.,  Disp: , Rfl:    meclizine (ANTIVERT) 25 MG tablet, Take 1 tablet (25 mg total) by mouth 3 (three) times daily as needed for dizziness., Disp: 30 tablet, Rfl: 0   MELATONIN PO, Take 1 tablet by mouth at bedtime as needed (sleep)., Disp: , Rfl:    methylphenidate (RITALIN) 10 MG tablet, Take 10 mg by mouth daily., Disp: , Rfl:    methylphenidate 27 MG PO CR tablet, Take 27 mg by mouth daily., Disp: , Rfl:    Multiple Vitamin (MULTI-VITAMIN) tablet, Take 1 tablet by mouth daily., Disp: , Rfl:    SYNTHROID 125 MCG tablet, Take 1 tablet (125 mcg total) by mouth See admin instructions. Take 125 mcg by mouth Monday - Saturday, Disp: 90 tablet, Rfl: 1      Objective:    BP 118/72 (BP Location: Left Arm)   Pulse 90   Temp 98 F (36.7 C) (Temporal)   Ht  (1.676 m)   Wt 177 lb 6.4 oz (80.5 kg)   SpO2 98%   BMI 28.63 kg/m   Wt Readings from Last 3 Encounters:  10/08/22 177 lb 6.4 oz (80.5 kg)  08/25/22 176 lb 3.2 oz (79.9 kg)  07/13/22 176 lb 3.2 oz (79.9 kg)    Physical Exam Constitutional:      General: She is not in acute distress.    Appearance: Normal appearance.  She is normal weight. She is not ill-appearing, toxic-appearing or diaphoretic.  HENT:     Head: Normocephalic.  Cardiovascular:     Rate and Rhythm: Normal rate.  Pulmonary:     Effort: Pulmonary effort is normal.  Musculoskeletal:        General: Normal range of motion.     Comments: Tenderness at arch and base of heel bil  Bunion present bil and tenderness palpated bil at site  Pain elicited bil in heel and arch with extension   Neurological:     General: No focal deficit present.     Mental Status: She is alert and oriented to person, place, and time. Mental status is at baseline.  Psychiatric:        Mood and Affect: Mood normal.        Behavior: Behavior normal.        Thought Content: Thought content normal.        Judgment: Judgment normal.            Assessment & Plan:  Plantar  fasciitis Assessment & Plan: New, worsening Advised of stretching exercises  Print out given to pt on this as well  Referral podiatry for eval/treat per pt request Recommended supportive shoes, inserts, as well as arch compression sleeves  Nsaids prn  Rest   Orders: -     Ambulatory referral to Podiatry  Elevated LFTs -     Hepatic function panel  Bilateral bunions Assessment & Plan: Recommendation for bunion pads  Referral podiatry for eval/treat  Orders: -     Ambulatory referral to Podiatry  Decreased thyroid stimulating hormone (TSH) level Assessment & Plan: Along with heat intolerance will recheck tsh pending results  Orders: -     Thyroid stimulating immunoglobulin -     TSH  Heat intolerance -     Thyroid stimulating immunoglobulin -     TSH     Return in about 1 year (around 10/08/2023) for f/u CPE.  Mort Sawyers, MSN, APRN, FNP-C [facility]

## 2022-10-11 ENCOUNTER — Encounter: Payer: Self-pay | Admitting: Family

## 2022-10-12 NOTE — Telephone Encounter (Signed)
See response to thyroid medication.

## 2022-10-13 LAB — THYROID STIMULATING IMMUNOGLOBULIN: TSI: <89 % baseline (ref <140)

## 2022-10-26 ENCOUNTER — Encounter: Payer: Self-pay | Admitting: Podiatry

## 2022-10-26 ENCOUNTER — Ambulatory Visit (INDEPENDENT_AMBULATORY_CARE_PROVIDER_SITE_OTHER): Payer: BC Managed Care – PPO

## 2022-10-26 ENCOUNTER — Ambulatory Visit (INDEPENDENT_AMBULATORY_CARE_PROVIDER_SITE_OTHER): Payer: Self-pay | Admitting: Podiatry

## 2022-10-26 DIAGNOSIS — M7752 Other enthesopathy of left foot: Secondary | ICD-10-CM

## 2022-10-26 DIAGNOSIS — M7751 Other enthesopathy of right foot: Secondary | ICD-10-CM | POA: Diagnosis not present

## 2022-10-26 DIAGNOSIS — M722 Plantar fascial fibromatosis: Secondary | ICD-10-CM | POA: Diagnosis not present

## 2022-10-26 DIAGNOSIS — M2021 Hallux rigidus, right foot: Secondary | ICD-10-CM | POA: Diagnosis not present

## 2022-10-26 DIAGNOSIS — M2022 Hallux rigidus, left foot: Secondary | ICD-10-CM

## 2022-10-26 MED ORDER — MELOXICAM 15 MG PO TABS: 15.0000 mg | ORAL_TABLET | Freq: Every day | ORAL | 0 refills | Status: DC | PRN

## 2022-10-26 NOTE — Progress Notes (Signed)
Subjective:   Patient ID: Laura Spencer, female   DOB: 52 y.o.   MRN: [id number]   HPI Chief Complaint  Patient presents with   Foot Pain    Arch bilateral (R>L) - aching x several months, AM pain, tried massaging, stretching, OTC insoles, Aleve/Tylenol  1st MPJ bilateral - bunion deformity x years, noticing more pain recently  4th toes bilateral - 3rd toes are starting to overlap and causing pressure against the 4th toes   New Patient (Initial Visit)    52 year old female presents the office for above concerns.  She states she did like to her dance barefoot.  Arch pains been ongoing for the last few months.  No injuries that she reports.  No numbness or tingling.   Review of Systems  All other systems reviewed and are negative.  Past Medical History:  Diagnosis Date   Anemia    Anxiety    Attention deficit hyperactivity disorder    Complication of anesthesia    Depression    Hashimoto's disease    Headache    hx of migraines   Hypothyroidism    PONV (postoperative nausea and vomiting)    Vertigo 02/2022    Past Surgical History:  Procedure Laterality Date   CESAREAN SECTION     x2   COLONOSCOPY     ROBOTIC ASSISTED SALPINGO OOPHERECTOMY N/A 06/23/2022   Procedure: XI ROBOTIC ASSISTED RIGHT SALPINGO OOPHORECTOMY, LEFT SALPINGOECTOMY;  Surgeon: Clide Cliff, MD;  Location: [facility];  Service: Gynecology;  Laterality: N/A;     Current Outpatient Medications:    meloxicam (MOBIC) 15 MG tablet, Take 1 tablet (15 mg total) by mouth daily as needed for pain., Disp: 30 tablet, Rfl: 0   ferrous sulfate 325 (65 FE) MG EC tablet, Take 325 mg by mouth daily., Disp: , Rfl:    meclizine (ANTIVERT) 25 MG tablet, Take 1 tablet (25 mg total) by mouth 3 (three) times daily as needed for dizziness., Disp: 30 tablet, Rfl: 0   MELATONIN PO, Take 1 tablet by mouth at bedtime as needed (sleep)., Disp: , Rfl:    methylphenidate (RITALIN) 10 MG tablet, Take 10 mg by mouth daily., Disp: ,  Rfl:    methylphenidate 27 MG PO CR tablet, Take 27 mg by mouth daily., Disp: , Rfl:    Multiple Vitamin (MULTI-VITAMIN) tablet, Take 1 tablet by mouth daily., Disp: , Rfl:    SYNTHROID 125 MCG tablet, Take 1 tablet (125 mcg total) by mouth See admin instructions. Take 125 mcg by mouth Monday - Saturday, Disp: 90 tablet, Rfl: 1  No Known Allergies         Objective:  Physical Exam  General: AAO x3, NAD  Dermatological: Skin is warm, dry and supple bilateral.  There are no open sores, no preulcerative lesions, no rash or signs of infection present.  Vascular: Dorsalis Pedis artery and Posterior Tibial artery pedal pulses are 2/4 bilateral with immedate capillary fill time.  There is no pain with calf compression, swelling, warmth, erythema.   Neruologic: Grossly intact via light touch bilateral.  Negative Tinel sign.  Musculoskeletal: Decreased range of motion of the first MPJs and crepitation with range of motion.  Dorsal spurring present at the first MPJ.  There is discomfort along the proximal of the foot along the medial band of the plantar fascia.  There is no area pinpoint tenderness.  No significant edema is no erythema.  Gait: Unassisted, Nonantalgic.       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  3 views of the feet were obtained.  Significant arthritic changes present of the first MPJ bilaterally.  Increased calcaneal inclination angle. -We discussed the conservative as well as surgical treatment options.  For now continue conservative care.  We discussed shoes, good arch supports given her high arch as well as hallux rigidus.  She can start with over-the-counter type inserts we discussed custom as needed. -Prescribed mobic. Discussed side effects  of the medication and directed to stop if any are to occur and call the office.  -We discussed resting, icing on a regular basis -Injection therapy if needed  Vivi Barrack DPM

## 2022-10-26 NOTE — Patient Instructions (Signed)

## 2022-11-02 ENCOUNTER — Encounter: Payer: Self-pay | Admitting: Family

## 2022-12-08 DIAGNOSIS — F4322 Adjustment disorder with anxiety: Secondary | ICD-10-CM | POA: Diagnosis not present

## 2022-12-09 ENCOUNTER — Encounter: Payer: Self-pay | Admitting: Family

## 2022-12-09 DIAGNOSIS — M21611 Bunion of right foot: Secondary | ICD-10-CM

## 2022-12-09 DIAGNOSIS — M722 Plantar fascial fibromatosis: Secondary | ICD-10-CM

## 2022-12-30 DIAGNOSIS — F4322 Adjustment disorder with anxiety: Secondary | ICD-10-CM | POA: Diagnosis not present

## 2023-01-01 DIAGNOSIS — F411 Generalized anxiety disorder: Secondary | ICD-10-CM | POA: Diagnosis not present

## 2023-01-01 DIAGNOSIS — F419 Anxiety disorder, unspecified: Secondary | ICD-10-CM | POA: Diagnosis not present

## 2023-01-01 DIAGNOSIS — F4322 Adjustment disorder with anxiety: Secondary | ICD-10-CM | POA: Diagnosis not present

## 2023-01-01 DIAGNOSIS — F4323 Adjustment disorder with mixed anxiety and depressed mood: Secondary | ICD-10-CM | POA: Diagnosis not present

## 2023-01-04 ENCOUNTER — Ambulatory Visit (INDEPENDENT_AMBULATORY_CARE_PROVIDER_SITE_OTHER): Payer: BC Managed Care – PPO | Admitting: Podiatry

## 2023-01-04 DIAGNOSIS — M722 Plantar fascial fibromatosis: Secondary | ICD-10-CM

## 2023-01-04 DIAGNOSIS — M2022 Hallux rigidus, left foot: Secondary | ICD-10-CM

## 2023-01-04 DIAGNOSIS — M2021 Hallux rigidus, right foot: Secondary | ICD-10-CM | POA: Diagnosis not present

## 2023-01-04 NOTE — Progress Notes (Signed)
Subjective:   Patient ID: Laura Spencer, female   DOB: 52 y.o.   MRN: [id number]   HPI Chief Complaint  Patient presents with   Flat Foot   Foot Pain     Arch bilateral (R>L) -      52 year old female presents the office for above concerns.  Overall feels that she is doing better.  No recent injuries or changes otherwise.  No new concerns.   Review of Systems  All other systems reviewed and are negative.       Objective:  Physical Exam  General: AAO x3, NAD  Dermatological: Skin is warm, dry and supple bilateral.  There are no open sores, no preulcerative lesions, no rash or signs of infection present.  Vascular: Dorsalis Pedis artery and Posterior Tibial artery pedal pulses are 2/4 bilateral with immedate capillary fill time.  There is no pain with calf compression, swelling, warmth, erythema.   Neruologic: Grossly intact via light touch bilateral.  Negative Tinel sign.  Musculoskeletal: Decreased range of motion of the first MPJs and crepitation with range of motion.  Dorsal spurring present at the first MPJ.  There is discomfort along the proximal of the foot along the medial band of the plantar fascia.  There is no area pinpoint tenderness.  No significant edema is no erythema.  Gait: Unassisted, Nonantalgic.       Assessment:   52 year old female with capsulitis, hallux rigidus; plantar fasciitis     Plan:  -Treatment options discussed including all alternatives, risks, and complications -Etiology of symptoms were discussed -Overall she is doing better.  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  Given the arthritis in her big toes as well as chronic foot pain she may benefit from arthritis workup including rheumatoid arthritis or other autoimmune issues.  Will defer her primary care doctor for this but she is already happy to order the blood work. -Anti-inflammatories as needed  No follow-ups on  file.  Vivi Barrack DPM

## 2023-01-13 DIAGNOSIS — F4322 Adjustment disorder with anxiety: Secondary | ICD-10-CM | POA: Diagnosis not present

## 2023-01-27 DIAGNOSIS — F4322 Adjustment disorder with anxiety: Secondary | ICD-10-CM | POA: Diagnosis not present

## 2023-01-29 ENCOUNTER — Other Ambulatory Visit: Payer: Self-pay | Admitting: Family

## 2023-01-29 DIAGNOSIS — E039 Hypothyroidism, unspecified: Secondary | ICD-10-CM

## 2023-01-29 NOTE — Telephone Encounter (Signed)
   Lab Results  Component Value Date   TSH 4.44 10/08/2022

## 2023-02-01 MED ORDER — SYNTHROID 125 MCG PO TABS: 125.0000 ug | ORAL_TABLET | ORAL | 0 refills | Status: DC

## 2023-02-17 DIAGNOSIS — F4322 Adjustment disorder with anxiety: Secondary | ICD-10-CM | POA: Diagnosis not present

## 2023-02-19 ENCOUNTER — Ambulatory Visit (INDEPENDENT_AMBULATORY_CARE_PROVIDER_SITE_OTHER): Payer: BC Managed Care – PPO | Admitting: Family

## 2023-02-19 ENCOUNTER — Encounter: Payer: Self-pay | Admitting: Family

## 2023-02-19 VITALS — BP 100/68 | HR 63 | Temp 98.0°F | Ht 66.0 in | Wt 178.2 lb

## 2023-02-19 DIAGNOSIS — E038 Other specified hypothyroidism: Secondary | ICD-10-CM

## 2023-02-19 DIAGNOSIS — E063 Autoimmune thyroiditis: Secondary | ICD-10-CM

## 2023-02-19 DIAGNOSIS — T781XXA Other adverse food reactions, not elsewhere classified, initial encounter: Secondary | ICD-10-CM | POA: Diagnosis not present

## 2023-02-19 DIAGNOSIS — Z23 Encounter for immunization: Secondary | ICD-10-CM | POA: Diagnosis not present

## 2023-02-19 DIAGNOSIS — Z780 Asymptomatic menopausal state: Secondary | ICD-10-CM | POA: Diagnosis not present

## 2023-02-19 DIAGNOSIS — J3089 Other allergic rhinitis: Secondary | ICD-10-CM | POA: Diagnosis not present

## 2023-02-19 DIAGNOSIS — M2550 Pain in unspecified joint: Secondary | ICD-10-CM | POA: Diagnosis not present

## 2023-02-19 DIAGNOSIS — R7989 Other specified abnormal findings of blood chemistry: Secondary | ICD-10-CM

## 2023-02-19 LAB — HEPATIC FUNCTION PANEL
ALT: 40 U/L — ABNORMAL HIGH (ref 0–35)
AST: 23 U/L (ref 0–37)
Albumin: 4.3 g/dL (ref 3.5–5.2)
Alkaline Phosphatase: 69 U/L (ref 39–117)
Bilirubin, Direct: 0.2 mg/dL (ref 0.0–0.3)
Total Bilirubin: 0.8 mg/dL (ref 0.2–1.2)
Total Protein: 7.5 g/dL (ref 6.0–8.3)

## 2023-02-19 LAB — SEDIMENTATION RATE: Sed Rate: 15 mm/h (ref 0–30)

## 2023-02-19 LAB — TSH: TSH: 4.26 u[IU]/mL (ref 0.35–5.50)

## 2023-02-19 NOTE — Assessment & Plan Note (Signed)
Symptomatic.  Repeat tsh pending results.  Continue synthroid 125 mcg once daily.  Educated pt on taking in am, prior to medications and food by at least 30 min to one hour. She has not been compliant with this as she had not been educated per her recollection.   Advised also to separate four hours from anti acid medications or vitamins.

## 2023-02-19 NOTE — Assessment & Plan Note (Signed)
Rheumatologic lab workup ordered pending results.  Ddx RA, PSA, symptomatic hypothyroid and or OA

## 2023-02-19 NOTE — Progress Notes (Signed)
Established Patient Office Visit  Subjective:      CC:  Chief Complaint  Patient presents with   Arthritis    HPI: Laura Spencer is a 52 y.o. female presenting on 02/19/2023 for Arthritis . Recently saw podiatry for bil foot pain, she was given recommendations for shoe changes with support as well as orthotics with some stressing exercises and she states this has been helpful and longer needing to take   She does state that she has bil stiffness in hands and also in bil knees and bil hips. Describes her pain as dull and achy 'all over' podiatry did also recommend she see her PCP for an arthritis workup. Her hand pain is mainly only in the am with some stiffness, lasting near about an hour to work out.   She is also going through menopause and having trouble losing weight. Still having hot flashes and waking up in the middle of night due to this.   Food sensitivities: not noticing any specific sensitives but does want to get tested for different food sensitivities.   Does take her thyroid medication separated from her other medication.       Social history:  Relevant past medical, surgical, family and social history reviewed and updated as indicated. Interim medical history since our last visit reviewed.  Allergies and medications reviewed and updated.  DATA REVIEWED: CHART IN EPIC     ROS: Negative unless specifically indicated above in HPI.    Current Outpatient Medications:    ferrous sulfate 325 (65 FE) MG EC tablet, Take 325 mg by mouth daily., Disp: , Rfl:    meclizine (ANTIVERT) 25 MG tablet, Take 1 tablet (25 mg total) by mouth 3 (three) times daily as needed for dizziness., Disp: 30 tablet, Rfl: 0   MELATONIN PO, Take 1 tablet by mouth at bedtime as needed (sleep)., Disp: , Rfl:    meloxicam (MOBIC) 15 MG tablet, Take 1 tablet (15 mg total) by mouth daily as needed for pain., Disp: 30 tablet, Rfl: 0   methylphenidate (RITALIN) 10 MG tablet, Take 10 mg by  mouth daily., Disp: , Rfl:    methylphenidate 27 MG PO CR tablet, Take 27 mg by mouth daily., Disp: , Rfl:    Multiple Vitamin (MULTI-VITAMIN) tablet, Take 1 tablet by mouth daily., Disp: , Rfl:    SYNTHROID 125 MCG tablet, Take 1 tablet (125 mcg total) by mouth See admin instructions. Take 125 mcg by mouth Monday - Saturday, Disp: 90 tablet, Rfl: 0      Objective:    BP 100/68 (BP Location: Left Arm, Patient Position: Sitting, Cuff Size: Normal)   Pulse 63   Temp 98 F (36.7 C) (Temporal)   Ht 5\' 6"  (1.676 m)   Wt 178 lb 3.2 oz (80.8 kg)   SpO2 99%   BMI 28.76 kg/m   Wt Readings from Last 3 Encounters:  02/19/23 178 lb 3.2 oz (80.8 kg)  10/08/22 177 lb 6.4 oz (80.5 kg)  08/25/22 176 lb 3.2 oz (79.9 kg)    Physical Exam Constitutional:      General: She is not in acute distress.    Appearance: Normal appearance. She is normal weight. She is not ill-appearing, toxic-appearing or diaphoretic.  HENT:     Head: Normocephalic.  Cardiovascular:     Rate and Rhythm: Normal rate and regular rhythm.  Pulmonary:     Effort: Pulmonary effort is normal.  Musculoskeletal:        General: Normal  range of motion.  Neurological:     General: No focal deficit present.     Mental Status: She is alert and oriented to person, place, and time. Mental status is at baseline.  Psychiatric:        Mood and Affect: Mood normal.        Behavior: Behavior normal.        Thought Content: Thought content normal.        Judgment: Judgment normal.           Assessment & Plan:  Elevated TSH -     TSH  Polyarthralgia Assessment & Plan: Rheumatologic lab workup ordered pending results.  Ddx RA, PSA, symptomatic hypothyroid and or OA  Orders: -     ANA -     Sedimentation rate -     Rheumatoid factor  Menopause Assessment & Plan: Pending liver function tests recommendation for black cohash over the counter.     Elevated LFTs Assessment & Plan: Repeat today pending results.  Pt  was taking tylenol which has since ceased.    Orders: -     Hepatic function panel  Gastrointestinal food sensitivity -     Allergens (22) Foods  Encounter for immunization -     Flu vaccine trivalent PF, 6mos and older(Flulaval,Afluria,Fluarix,Fluzone)  Hypothyroidism due to Hashimoto's thyroiditis Assessment & Plan: Symptomatic.  Repeat tsh pending results.  Continue synthroid 125 mcg once daily.  Educated pt on taking in am, prior to medications and food by at least 30 min to one hour. She has not been compliant with this as she had not been educated per her recollection.   Advised also to separate four hours from anti acid medications or vitamins.        Return in about 1 year (around 02/19/2024) for f/u CPE.  Mort Sawyers, MSN, APRN, FNP-C [facility]

## 2023-02-19 NOTE — Patient Instructions (Signed)
  Recommendation for black cohash over the counter for menopause as long as your liver functions come back negative.    Regards,   Mort Sawyers FNP-C

## 2023-02-19 NOTE — Addendum Note (Signed)
Addended by: Mort Sawyers on: 02/19/2023 10:16 AM   Modules accepted: Level of Service

## 2023-02-19 NOTE — Assessment & Plan Note (Signed)
Repeat today pending results.  Pt was taking tylenol which has since ceased.

## 2023-02-19 NOTE — Assessment & Plan Note (Signed)
Pending liver function tests recommendation for black cohash over the counter.

## 2023-02-21 LAB — ALLERGENS (22) FOODS IGG
Casein IgG: 2.6 ug/mL — ABNORMAL HIGH (ref 0.0–1.9)
Cheese, Mold Type IgG: 13.9 ug/mL — ABNORMAL HIGH (ref 0.0–1.9)
Chicken IgG: 3.7 ug/mL — ABNORMAL HIGH (ref 0.0–1.9)
Chocolate/Cacao IgG: 2.7 ug/mL — ABNORMAL HIGH (ref 0.0–1.9)
Coffee IgG: 4.3 ug/mL — ABNORMAL HIGH (ref 0.0–1.9)
Corn IgG: 49.4 ug/mL — ABNORMAL HIGH (ref 0.0–1.9)
Egg, Whole IgG: 35 ug/mL — ABNORMAL HIGH (ref 0.0–1.9)
Green Bean IgG: 2.3 ug/mL — ABNORMAL HIGH (ref 0.0–1.9)
Haddock IgG: <2 ug/mL (ref 0.0–1.9)
Lamb IgG: <2 ug/mL (ref 0.0–1.9)
Oat IgG: 6.5 ug/mL — ABNORMAL HIGH (ref 0.0–1.9)
Onion IgG: 2.2 ug/mL — ABNORMAL HIGH (ref 0.0–1.9)
Peanut IgG: 3.3 ug/mL — ABNORMAL HIGH (ref 0.0–1.9)
Pork IgG: 3.9 ug/mL — ABNORMAL HIGH (ref 0.0–1.9)
Potato, White, IgG: <2 ug/mL (ref 0.0–1.9)
Rye IgG: 8.2 ug/mL — ABNORMAL HIGH (ref 0.0–1.9)
Shrimp IgG: 2.3 ug/mL — ABNORMAL HIGH (ref 0.0–1.9)
Soybean IgG: <2 ug/mL (ref 0.0–1.9)
Tomato IgG: <2 ug/mL (ref 0.0–1.9)
Wheat IgG: 7.6 ug/mL — ABNORMAL HIGH (ref 0.0–1.9)
Yeast IgG: 2.3 ug/mL — ABNORMAL HIGH (ref 0.0–1.9)

## 2023-02-22 ENCOUNTER — Other Ambulatory Visit: Payer: Self-pay | Admitting: Family

## 2023-02-22 DIAGNOSIS — E039 Hypothyroidism, unspecified: Secondary | ICD-10-CM

## 2023-02-22 LAB — ANTI-NUCLEAR AB-TITER (ANA TITER): ANA Titer 1: 1:80 {titer} — ABNORMAL HIGH

## 2023-02-22 LAB — ANA: Anti Nuclear Antibody (ANA): POSITIVE — AB

## 2023-02-22 LAB — RHEUMATOID FACTOR: Rheumatoid fact SerPl-aCnc: <10 [IU]/mL (ref <14)

## 2023-02-22 MED ORDER — SYNTHROID 125 MCG PO TABS
125.0000 ug | ORAL_TABLET | Freq: Every day | ORAL | 0 refills | Status: DC
Start: 2023-02-22 — End: 2023-07-20

## 2023-02-23 ENCOUNTER — Encounter: Payer: Self-pay | Admitting: Family

## 2023-02-23 ENCOUNTER — Other Ambulatory Visit: Payer: Self-pay | Admitting: Family

## 2023-02-23 DIAGNOSIS — M2550 Pain in unspecified joint: Secondary | ICD-10-CM

## 2023-02-23 DIAGNOSIS — R768 Other specified abnormal immunological findings in serum: Secondary | ICD-10-CM

## 2023-02-24 DIAGNOSIS — F4322 Adjustment disorder with anxiety: Secondary | ICD-10-CM | POA: Diagnosis not present

## 2023-03-01 ENCOUNTER — Ambulatory Visit (INDEPENDENT_AMBULATORY_CARE_PROVIDER_SITE_OTHER): Payer: BC Managed Care – PPO

## 2023-03-01 DIAGNOSIS — M7752 Other enthesopathy of left foot: Secondary | ICD-10-CM | POA: Diagnosis not present

## 2023-03-01 DIAGNOSIS — M2021 Hallux rigidus, right foot: Secondary | ICD-10-CM

## 2023-03-01 DIAGNOSIS — M722 Plantar fascial fibromatosis: Secondary | ICD-10-CM | POA: Diagnosis not present

## 2023-03-01 DIAGNOSIS — M7751 Other enthesopathy of right foot: Secondary | ICD-10-CM | POA: Diagnosis not present

## 2023-03-01 DIAGNOSIS — M2022 Hallux rigidus, left foot: Secondary | ICD-10-CM | POA: Diagnosis not present

## 2023-03-01 NOTE — Progress Notes (Signed)
  Patient was seen, measured for custom molded foot orthotics  Patient will benefit from CFO's as they will help provide total contact to MLA's helping to better distribute body weight across BIL feet greater reducing plantar pressure and pain and to also encourage FF and RF alignment  Patient was scanned items to be ordered and fit when in  [employer], CFo, CFm

## 2023-03-10 DIAGNOSIS — F4322 Adjustment disorder with anxiety: Secondary | ICD-10-CM | POA: Diagnosis not present

## 2023-03-24 DIAGNOSIS — F4322 Adjustment disorder with anxiety: Secondary | ICD-10-CM | POA: Diagnosis not present

## 2023-03-26 DIAGNOSIS — F411 Generalized anxiety disorder: Secondary | ICD-10-CM | POA: Diagnosis not present

## 2023-03-26 DIAGNOSIS — F4322 Adjustment disorder with anxiety: Secondary | ICD-10-CM | POA: Diagnosis not present

## 2023-03-26 DIAGNOSIS — F331 Major depressive disorder, recurrent, moderate: Secondary | ICD-10-CM | POA: Diagnosis not present

## 2023-03-26 DIAGNOSIS — F419 Anxiety disorder, unspecified: Secondary | ICD-10-CM | POA: Diagnosis not present

## 2023-04-14 ENCOUNTER — Encounter: Payer: Self-pay | Admitting: Family

## 2023-04-20 ENCOUNTER — Other Ambulatory Visit (INDEPENDENT_AMBULATORY_CARE_PROVIDER_SITE_OTHER): Payer: BC Managed Care – PPO

## 2023-04-20 DIAGNOSIS — E039 Hypothyroidism, unspecified: Secondary | ICD-10-CM | POA: Diagnosis not present

## 2023-04-20 LAB — T4, FREE: Free T4: 1.08 ng/dL (ref 0.60–1.60)

## 2023-04-20 LAB — TSH: TSH: 1.44 u[IU]/mL (ref 0.35–5.50)

## 2023-04-26 DIAGNOSIS — F529 Unspecified sexual dysfunction not due to a substance or known physiological condition: Secondary | ICD-10-CM | POA: Diagnosis not present

## 2023-04-26 DIAGNOSIS — N951 Menopausal and female climacteric states: Secondary | ICD-10-CM | POA: Diagnosis not present

## 2023-04-26 DIAGNOSIS — R6882 Decreased libido: Secondary | ICD-10-CM | POA: Diagnosis not present

## 2023-04-26 DIAGNOSIS — Z780 Asymptomatic menopausal state: Secondary | ICD-10-CM | POA: Diagnosis not present

## 2023-04-27 ENCOUNTER — Ambulatory Visit: Payer: BC Managed Care – PPO

## 2023-04-27 NOTE — Progress Notes (Signed)
Patient presents today to pick up custom molded foot orthotics, diagnosed with Plantar Fasciitis by Dr. Ardelle Anton.   Orthotics were dispensed and fit was satisfactory. Reviewed instructions for break-in and wear. Written instructions given to patient.  Patient will follow up as needed.  Addison Bailey Cped, CFo, CFm

## 2023-06-04 DIAGNOSIS — F331 Major depressive disorder, recurrent, moderate: Secondary | ICD-10-CM | POA: Diagnosis not present

## 2023-06-04 DIAGNOSIS — F411 Generalized anxiety disorder: Secondary | ICD-10-CM | POA: Diagnosis not present

## 2023-06-04 DIAGNOSIS — F4323 Adjustment disorder with mixed anxiety and depressed mood: Secondary | ICD-10-CM | POA: Diagnosis not present

## 2023-06-04 DIAGNOSIS — F4322 Adjustment disorder with anxiety: Secondary | ICD-10-CM | POA: Diagnosis not present

## 2023-06-28 DIAGNOSIS — Z780 Asymptomatic menopausal state: Secondary | ICD-10-CM | POA: Diagnosis not present

## 2023-06-28 DIAGNOSIS — R6882 Decreased libido: Secondary | ICD-10-CM | POA: Diagnosis not present

## 2023-07-16 NOTE — Progress Notes (Signed)
Office Visit Note  Patient: Laura Spencer             Date of Birth: [birth year]           MRN: [id number]             PCP: Mort Sawyers, FNP Referring: Mort Sawyers, FNP Visit Date: 07/30/2023 Occupation: @GUAROCC @  Subjective:  Pain in multiple joints  History of Present Illness: Laura Spencer is a 53 y.o. female seen in consultation per request of her PCP for the evaluation of joint pain.  According the patient she started noticing change in her fingers over the last 4 years.  She is also noticed change in her toes for the last 5 years.  She states she has seen a podiatrist who took x-rays and advised her that she had osteoarthritis.  He also gave her custom orthotics.  She states she danced as a child and has been installed dancing.  She used to dance on her toes which was painful.  For the last 1 year she has been also having some difficulty climbing stairs due to discomfort in her left knee joint and her left hip which she points to the trochanteric region.  She states she had injury to her right shoulder joint in the past which gives her occasional discomfort.  None of the other joints are painful.  She denies any history of joint swelling.  There is no personal or family history of psoriasis.  There is no family history of autoimmune disease.  She is a Airline pilot.  She enjoys dancing.  She recently joined a gym and is planning to start cardio and strength training.  She is gravida 2, para 2.  There is no history of preeclampsia or DVTs.  She is married and has an IUD for contraception.    Activities of Daily Living:  Patient reports morning stiffness for all day. Patient Reports nocturnal pain.  Difficulty dressing/grooming: Reports Difficulty climbing stairs: Reports Difficulty getting out of chair: Denies Difficulty using hands for taps, buttons, cutlery, and/or writing: Reports  Review of Systems  Constitutional:  Positive for fatigue.  HENT:  Negative for mouth  sores and mouth dryness.   Eyes:  Negative for dryness.  Respiratory:  Negative for shortness of breath.   Cardiovascular:  Negative for chest pain and palpitations.  Gastrointestinal:  Negative for blood in stool, constipation and diarrhea.  Endocrine: Negative for increased urination.  Genitourinary:  Negative for involuntary urination.  Musculoskeletal:  Positive for joint pain, gait problem, joint pain and morning stiffness. Negative for joint swelling, myalgias, muscle weakness, muscle tenderness and myalgias.  Skin:  Negative for color change, rash, hair loss and sensitivity to sunlight.  Allergic/Immunologic: Negative for susceptible to infections.  Neurological:  Negative for dizziness and headaches.  Hematological:  Negative for swollen glands.  Psychiatric/Behavioral:  Negative for depressed mood and sleep disturbance. The patient is not nervous/anxious.     PMFS History:  Patient Active Problem List   Diagnosis Date Noted   Menopause 02/19/2023   Bilateral bunions 10/08/2022   Plantar fasciitis 10/08/2022   Elevated LFTs 10/08/2022   Non-seasonal allergic rhinitis 07/13/2022   Iron deficiency anemia 07/13/2022   Keratosis pilaris 07/13/2022   Vertigo 07/13/2022   Plantar wart 09/06/2015   Migraine 07/19/2015   Hypothyroidism 06/14/2012    Past Medical History:  Diagnosis Date   Anemia    Anxiety    Attention deficit hyperactivity disorder    Complication of anesthesia  Depression    Hashimoto's disease    Headache    hx of migraines   Hypothyroidism    PONV (postoperative nausea and vomiting)    Vertigo 02/2022    Family History  Problem Relation Age of Onset   Mental illness Mother    Depression Mother    Anxiety disorder Mother    Other Mother        hx of ankle surgery   Thyroid disease Mother    Angina Father    Heart disease Father        bypass h/o   Healthy Son    Epilepsy Son    ADD / ADHD Son    Thyroid disease Half-Sister    Obesity  Half-Sister    Bipolar disorder Half-Sister    Breast cancer Neg Hx    Ovarian cancer Neg Hx    Colon cancer Neg Hx    Endometrial cancer Neg Hx    Past Surgical History:  Procedure Laterality Date   CESAREAN SECTION     x2   COLONOSCOPY     ROBOTIC ASSISTED SALPINGO OOPHERECTOMY N/A 06/23/2022   Procedure: XI ROBOTIC ASSISTED RIGHT SALPINGO OOPHORECTOMY, LEFT SALPINGOECTOMY;  Surgeon: Clide Cliff, MD;  Location: [facility];  Service: Gynecology;  Laterality: N/A;   Social History   Social History Narrative   Not on file   Immunization History  Administered Date(s) Administered   Influenza, Seasonal, Injecte, Preservative Fre 02/19/2023   Influenza,inj,Quad PF,6+ Mos 07/26/2015   Influenza-Unspecified 06/14/2012   PFIZER(Purple Top)SARS-COV-2 Vaccination 08/16/2019, 09/13/2019, 05/20/2020, 11/29/2020, 06/20/2021   Tdap 07/26/2015     Objective: Vital Signs: BP 116/79 (BP Location: Left Arm, Patient Position: Sitting, Cuff Size: Normal)   Pulse 68   Resp 15   Ht 5\' 6"  (1.676 m)   Wt 179 lb 12.8 oz (81.6 kg)   BMI 29.02 kg/m    Physical Exam Vitals and nursing note reviewed.  Constitutional:      Appearance: She is well-developed.  HENT:     Head: Normocephalic and atraumatic.  Eyes:     Conjunctiva/sclera: Conjunctivae normal.  Cardiovascular:     Rate and Rhythm: Normal rate and regular rhythm.     Heart sounds: Normal heart sounds.  Pulmonary:     Effort: Pulmonary effort is normal.     Breath sounds: Normal breath sounds.  Abdominal:     General: Bowel sounds are normal.     Palpations: Abdomen is soft.  Musculoskeletal:     Cervical back: Normal range of motion.  Lymphadenopathy:     Cervical: No cervical adenopathy.  Skin:    General: Skin is warm and dry.     Capillary Refill: Capillary refill takes less than 2 seconds.  Neurological:     Mental Status: She is alert and oriented to person, place, and time.  Psychiatric:        Behavior: Behavior  normal.      Musculoskeletal Exam: She had limited left lateral rotation of the cervical spine.  There was no tenderness over thoracic or lumbar spine.  She had good mobility in her lumbar spine.  Shoulders, elbows, wrist joints were in good range of motion with no synovitis.  She had bilateral DIP thickening.  No MCP or intercarpal joint tenderness was noted.  She had good range of motion of bilateral hip joints with tenderness over left trochanteric bursa.  She had discomfort range of motion of her left knee joint without any warmth swelling or  effusion.  She has thickening of bilateral first MTP joint, PIP and DIP joints without any synovitis.  CDAI Exam: CDAI Score: -- Patient Global: --; Provider Global: -- Swollen: --; Tender: -- Joint Exam 07/30/2023   No joint exam has been documented for this visit   There is currently no information documented on the homunculus. Go to the Rheumatology activity and complete the homunculus joint exam.  Investigation: No additional findings.  Imaging: No results found.  Recent Labs: Lab Results  Component Value Date   WBC 6.0 07/13/2022   HGB 13.5 07/13/2022   PLT 278.0 07/13/2022   NA 137 07/13/2022   K 4.3 07/13/2022   CL 101 07/13/2022   CO2 29 07/13/2022   GLUCOSE 79 07/13/2022   BUN 23 07/13/2022   CREATININE 0.75 07/13/2022   BILITOT 0.8 02/19/2023   ALKPHOS 69 02/19/2023   AST 23 02/19/2023   ALT 40 (H) 02/19/2023   PROT 7.5 02/19/2023   ALBUMIN 4.3 02/19/2023   CALCIUM 9.4 07/13/2022    Speciality Comments: No specialty comments available.  Procedures:  No procedures performed Allergies: Patient has no known allergies.   Assessment / Plan:     Visit Diagnoses: Polyarthralgia-patient complains of pain and discomfort in her right shoulder, bilateral hands, left trochanteric bursa, left knee and her feet.  She denies any history of joint swelling.  No synovitis was noted on the examination today.  Positive ANA  (antinuclear antibody) - 02/19/23: ANA 1:80NS, TSH 4.26, ESR 15, RF<10 -her ANA is low titer positive. She gives history of fatigue and joint pain.  There is no history of oral ulcers, nasal ulcers, malar rash, sicca symptoms, Raynaud's phenomenon, photosensitivity or lymphadenopathy.  To complete the workup I will obtain following labs today.  Plan: COMPLETE METABOLIC PANEL WITH GFR, CBC with Differential/Platelet, Protein / creatinine ratio, urine, ANA, Anti-scleroderma antibody, RNP Antibody, Anti-Smith antibody, Sjogrens syndrome-A extractable nuclear antibody, Sjogrens syndrome-B extractable nuclear antibody, Anti-DNA antibody, double-stranded, C3 and C4  Pain in both hands -she complains of pain and discomfort in her bilateral hands.  No synovitis was noted.  She had bilateral DIP thickening.  Plan: XR Hand 2 View Right, XR Hand 2 View Left.  X-rays showed bilateral severe CMC narrowing and subluxation.  Bilateral PIP and DIP narrowing with bilateral second DIP subluxation was noted.  The findings were suggestive of osteoarthritis.  Detailed counseling and osteoarthritis was provided.  Joint protection muscle strengthening was discussed.  Patient was referred to physical therapy.  A handout on hand exercises was given.  Use of natural anti-inflammatories was discussed.  Trochanteric bursitis, left hip-she had tenderness over left trochanteric bursa region consistent with left trochanteric bursitis.  She had good range of motion of her hip joint.  Distal counts regarding trochanteric bursitis was provided.  A handout on IT band stretches was given.  Chronic pain of left knee -she has been having intermittent discomfort in her left knee.  No warmth swelling or effusion was noted.  Plan: XR KNEE 3 VIEW LEFT.  X-rays were unremarkable.  A handout on lower extremity exercises was given.  She was referred to physical therapy.  Bilateral bunions-she has bilateral first MTP thickening.  Previous x-rays were  reviewed with the patient.  Which showed osteoarthritic changes in the bilateral first MTP with severe narrowing and PIP and DIP narrowing.  Joint protection and proper fitting shoes were advised.  Plantar fasciitis-currently symptomatic.  Elevated LFTs-she had elevated LFTs in January 2024 which gradually improved but  did not normalize.  Will check labs today.  Other medical problems are listed as follows:  History of iron deficiency anemia  Hypothyroidism due to Hashimoto thyroiditis  Vertigo  Keratosis pilaris  Hx of migraines  Anxiety and depression - in remission  Attention deficit hyperactivity disorder (ADHD), combined type  Orders: Orders Placed This Encounter  Procedures   XR Hand 2 View Right   XR Hand 2 View Left   XR KNEE 3 VIEW LEFT   COMPLETE METABOLIC PANEL WITH GFR   CBC with Differential/Platelet   Protein / creatinine ratio, urine   ANA   Anti-scleroderma antibody   RNP Antibody   Anti-Smith antibody   Sjogrens syndrome-A extractable nuclear antibody   Sjogrens syndrome-B extractable nuclear antibody   Anti-DNA antibody, double-stranded   C3 and C4   No orders of the defined types were placed in this encounter.    Follow-Up Instructions: Return for Osteoarthritis.   Pollyann Savoy, MD  Note - This record has been created using Animal nutritionist.  Chart creation errors have been sought, but may not always  have been located. Such creation errors do not reflect on  the standard of medical care.

## 2023-07-20 ENCOUNTER — Other Ambulatory Visit: Payer: Self-pay | Admitting: *Deleted

## 2023-07-20 DIAGNOSIS — E039 Hypothyroidism, unspecified: Secondary | ICD-10-CM

## 2023-07-20 MED ORDER — SYNTHROID 125 MCG PO TABS: 125.0000 ug | ORAL_TABLET | Freq: Every day | ORAL | 1 refills | Status: DC

## 2023-07-30 ENCOUNTER — Ambulatory Visit: Payer: BC Managed Care – PPO

## 2023-07-30 ENCOUNTER — Telehealth: Payer: Self-pay

## 2023-07-30 ENCOUNTER — Encounter: Payer: Self-pay | Admitting: Rheumatology

## 2023-07-30 ENCOUNTER — Ambulatory Visit: Payer: BC Managed Care – PPO | Attending: Rheumatology | Admitting: Rheumatology

## 2023-07-30 VITALS — BP 116/79 | HR 68 | Resp 15 | Ht 66.0 in | Wt 179.8 lb

## 2023-07-30 DIAGNOSIS — L858 Other specified epidermal thickening: Secondary | ICD-10-CM

## 2023-07-30 DIAGNOSIS — M79641 Pain in right hand: Secondary | ICD-10-CM

## 2023-07-30 DIAGNOSIS — Z862 Personal history of diseases of the blood and blood-forming organs and certain disorders involving the immune mechanism: Secondary | ICD-10-CM

## 2023-07-30 DIAGNOSIS — M25562 Pain in left knee: Secondary | ICD-10-CM

## 2023-07-30 DIAGNOSIS — M21611 Bunion of right foot: Secondary | ICD-10-CM

## 2023-07-30 DIAGNOSIS — F32A Depression, unspecified: Secondary | ICD-10-CM

## 2023-07-30 DIAGNOSIS — R7989 Other specified abnormal findings of blood chemistry: Secondary | ICD-10-CM

## 2023-07-30 DIAGNOSIS — M21612 Bunion of left foot: Secondary | ICD-10-CM

## 2023-07-30 DIAGNOSIS — M2550 Pain in unspecified joint: Secondary | ICD-10-CM

## 2023-07-30 DIAGNOSIS — M79642 Pain in left hand: Secondary | ICD-10-CM

## 2023-07-30 DIAGNOSIS — M7062 Trochanteric bursitis, left hip: Secondary | ICD-10-CM

## 2023-07-30 DIAGNOSIS — F902 Attention-deficit hyperactivity disorder, combined type: Secondary | ICD-10-CM

## 2023-07-30 DIAGNOSIS — E063 Autoimmune thyroiditis: Secondary | ICD-10-CM

## 2023-07-30 DIAGNOSIS — G8929 Other chronic pain: Secondary | ICD-10-CM

## 2023-07-30 DIAGNOSIS — R768 Other specified abnormal immunological findings in serum: Secondary | ICD-10-CM

## 2023-07-30 DIAGNOSIS — Z8669 Personal history of other diseases of the nervous system and sense organs: Secondary | ICD-10-CM

## 2023-07-30 DIAGNOSIS — F419 Anxiety disorder, unspecified: Secondary | ICD-10-CM

## 2023-07-30 DIAGNOSIS — R42 Dizziness and giddiness: Secondary | ICD-10-CM

## 2023-07-30 DIAGNOSIS — M722 Plantar fascial fibromatosis: Secondary | ICD-10-CM

## 2023-07-30 NOTE — Patient Instructions (Signed)
Exercises for Chronic Knee Pain Chronic knee pain is pain that lasts longer than 3 months. For most people with chronic knee pain, exercise and weight loss is an important part of treatment. Your health care provider may want you to focus on: Making the muscles that support your knee stronger. This can take pressure off your knee and reduce pain. Preventing knee stiffness. How far you can move your knee, keeping it there or making it farther. Losing weight (if this applies) to take pressure off your knee, lower your risk for injury, and make it easier for you to exercise. Your provider will help you make an exercise program that fits your needs and physical abilities. Below are simple, low-impact exercises you can do at home. Ask your provider or physical therapist how often you should do your exercise program and how many times to repeat each exercise. General safety tips  Get your provider's approval before doing any exercises. Start slowly and stop any time you feel pain. Do not exercise if your knee pain is flaring up. Warm up first. Stretching a cold muscle can cause an injury. Do 5-10 minutes of easy movement or light stretching before beginning your exercises. Do 5-10 minutes of low-impact activity (like walking or cycling) before starting strengthening exercises. Contact your provider any time you have pain during or after exercising. Exercise can cause discomfort but should not be painful. It is normal to be a little stiff or sore after exercising. Stretching and range-of-motion exercises Front thigh stretch  Stand up straight and support your body by holding on to a chair or resting one hand on a wall. With your legs straight and close together, bend one knee to lift your heel up toward your butt. Using one hand for support, grab your ankle with your free hand. Pull your foot up closer toward your butt to feel the stretch in front of your thigh. Hold the stretch for 30  seconds. Repeat __________ times. Complete this exercise __________ times a day. Back thigh stretch  Sit on the floor with your back straight and your legs out straight in front of you. Place the palms of your hands on the floor and slide them toward your feet as you bend at the hip. Try to touch your nose to your knees and feel the stretch in the back of your thighs. Hold for 30 seconds. Repeat __________ times. Complete this exercise __________ times a day. Calf stretch  Stand facing a wall. Place the palms of your hands flat against the wall, arms extended, and lean slightly against the wall. Get into a lunge position with one leg bent at the knee and the other leg stretched out straight behind you. Keep both feet facing the wall and increase the bend in your knee while keeping the heel of the other leg flat on the ground. You should feel the stretch in your calf. Hold for 30 seconds. Repeat __________ times. Complete this exercise __________ times a day. Strengthening exercises Straight leg lift  Lie on your back with one knee bent and the other leg out straight. Slowly lift the straight leg without bending the knee. Lift until your foot is about 12 inches (30 cm) off the floor. Hold for 3-5 seconds and slowly lower your leg. Repeat __________ times. Complete this exercise __________ times a day. Single leg dip  Stand between two chairs and put both hands on the backs of the chairs for support. Extend one leg out straight with your body  weight resting on the heel of the standing leg. Slowly bend your standing knee to dip your body to the level that is comfortable for you. Hold for 3-5 seconds. Repeat __________ times. Complete this exercise __________ times a day. Hamstring curls  Stand straight, knees close together, facing the back of a chair. Hold on to the back of a chair with both hands. Keep one leg straight. Bend the other knee while bringing the heel up toward the butt  until the knee is bent at a 90-degree angle (right angle). Hold for 3-5 seconds. Repeat __________ times. Complete this exercise __________ times a day. Wall squat  Stand straight with your back, hips, and head against a wall. Step forward one foot at a time with your back still against the wall. Your feet should be 2 feet (61 cm) from the wall at shoulder width. Keeping your back, hips, and head against the wall, slide down the wall to as close to a sitting position as you can get. Hold for 5-10 seconds, then slowly slide back up. Repeat __________ times. Complete this exercise __________ times a day. Step-ups  Stand in front of a sturdy platform or stool that is about 6 inches (15 cm) high. Slowly step up with your left / right foot, keeping your knee in line with your hip and foot. Do not let your knee bend so far that you cannot see your toes. Hold on to a chair for balance, but do not use it for support. Slowly unlock your knee and lower yourself to the starting position. Repeat __________ times. Complete this exercise __________ times a day. Contact a health care provider if: Your exercises cause pain. Your pain is worse after you exercise. Your pain prevents you from doing your exercises. This information is not intended to replace advice given to you by your health care provider. Make sure you discuss any questions you have with your health care provider. Document Revised: 06/16/2022 Document Reviewed: 06/16/2022 [employer] Patient Education  2024 [employer] Hand Exercises Hand exercises can be helpful for almost anyone. They can strengthen your hands and improve flexibility and movement. The exercises can also increase blood flow to the hands. These results can make your work and daily tasks easier for you. Hand exercises can be especially helpful for people who have joint pain from arthritis or nerve damage from using their hands over and over. These exercises can also help  people who injure a hand. Exercises Most of these hand exercises are gentle stretching and motion exercises. It is usually safe to do them often throughout the day. Warming up your hands before exercise may help reduce stiffness. You can do this with gentle massage or by placing your hands in warm water for 10-15 minutes. It is normal to feel some stretching, pulling, tightness, or mild discomfort when you begin new exercises. In time, this will improve. Remember to always be careful and stop right away if you feel sudden, very bad pain or your pain gets worse. You want to get better and be safe. Ask your health care provider which exercises are safe for you. Do exercises exactly as told by your provider and adjust them as told. Do not begin these exercises until told by your provider. Knuckle bend or "claw" fist  Stand or sit with your arm, hand, and all five fingers pointed straight up. Make sure to keep your wrist straight. Gently bend your fingers down toward your palm until the tips of your fingers  are touching your palm. Keep your big knuckle straight and only bend the small knuckles in your fingers. Hold this position for 10 seconds. Straighten your fingers back to your starting position. Repeat this exercise 5-10 times with each hand. Full finger fist  Stand or sit with your arm, hand, and all five fingers pointed straight up. Make sure to keep your wrist straight. Gently bend your fingers into your palm until the tips of your fingers are touching the middle of your palm. Hold this position for 10 seconds. Extend your fingers back to your starting position, stretching every joint fully. Repeat this exercise 5-10 times with each hand. Straight fist  Stand or sit with your arm, hand, and all five fingers pointed straight up. Make sure to keep your wrist straight. Gently bend your fingers at the big knuckle, where your fingers meet your hand, and at the middle knuckle. Keep the knuckle at  the tips of your fingers straight and try to touch the bottom of your palm. Hold this position for 10 seconds. Extend your fingers back to your starting position, stretching every joint fully. Repeat this exercise 5-10 times with each hand. Tabletop  Stand or sit with your arm, hand, and all five fingers pointed straight up. Make sure to keep your wrist straight. Gently bend your fingers at the big knuckle, where your fingers meet your hand, as far down as you can. Keep the small knuckles in your fingers straight. Think of forming a tabletop with your fingers. Hold this position for 10 seconds. Extend your fingers back to your starting position, stretching every joint fully. Repeat this exercise 5-10 times with each hand. Finger spread  Place your hand flat on a table with your palm facing down. Make sure your wrist stays straight. Spread your fingers and thumb apart from each other as far as you can until you feel a gentle stretch. Hold this position for 10 seconds. Bring your fingers and thumb tight together again. Hold this position for 10 seconds. Repeat this exercise 5-10 times with each hand. Making circles  Stand or sit with your arm, hand, and all five fingers pointed straight up. Make sure to keep your wrist straight. Make a circle by touching the tip of your thumb to the tip of your index finger. Hold for 10 seconds. Then open your hand wide. Repeat this motion with your thumb and each of your fingers. Repeat this exercise 5-10 times with each hand. Thumb motion  Sit with your forearm resting on a table and your wrist straight. Your thumb should be facing up toward the ceiling. Keep your fingers relaxed as you move your thumb. Lift your thumb up as high as you can toward the ceiling. Hold for 10 seconds. Bend your thumb across your palm as far as you can, reaching the tip of your thumb for the small finger (pinkie) side of your palm. Hold for 10 seconds. Repeat this exercise  5-10 times with each hand. Grip strengthening  Hold a stress ball or other soft ball in the middle of your hand. Slowly increase the pressure, squeezing the ball as much as you can without causing pain. Think of bringing the tips of your fingers into the middle of your palm. All of your finger joints should bend when doing this exercise. Hold your squeeze for 10 seconds, then relax. Repeat this exercise 5-10 times with each hand. Contact a health care provider if: Your hand pain or discomfort gets much worse when you  do an exercise. Your hand pain or discomfort does not improve within 2 hours after you exercise. If you have either of these problems, stop doing these exercises right away. Do not do them again unless your provider says that you can. Get help right away if: You develop sudden, severe hand pain or swelling. If this happens, stop doing these exercises right away. Do not do them again unless your provider says that you can. This information is not intended to replace advice given to you by your health care provider. Make sure you discuss any questions you have with your health care provider. Document Revised: 06/16/2022 Document Reviewed: 06/16/2022 [employer] Patient Education  2024 [employer] Iliotibial Band Syndrome Rehab Ask your health care provider which exercises are safe for you. Do exercises exactly as told by your provider and adjust them as told. It's normal to feel mild stretching, pulling, tightness, or discomfort as you do these exercises. Stop right away if you feel sudden pain or your pain gets a lot worse. Do not begin these exercises until told by your provider. Stretching and range-of-motion exercises These exercises warm up your muscles and joints. They also improve the movement and flexibility of your hip and pelvis. Quadriceps stretch, prone  Lie face down (prone) on a firm surface like a bed or padded floor. Bend your left / right knee. Reach back to hold  your ankle or pant leg. If you can't reach your ankle or pant leg, use a belt looped around your foot and grab the belt instead. Gently pull your heel toward your butt. Your knee should not slide out to the side. You should feel a stretch in the front of your thigh and knee, also called the quadriceps. Hold this position for __________ seconds. Repeat __________ times. Complete this exercise __________ times a day. Iliotibial band stretch The iliotibial band is a strip of tissue that runs along the outside of your hip down to your knee. Lie on your side with your left / right leg on top. Bend both knees and grab your left / right ankle. Stretch out your bottom arm to help you balance. Slowly bring your top knee back so your thigh goes behind your back. Slowly lower your top leg toward the floor until you feel a gentle stretch on the outside of your left / right hip and thigh. If you don't feel a stretch and your knee won't go farther, place the heel of your other foot on top of your knee and pull your knee down toward the floor with your foot. Hold this position for __________ seconds. Repeat __________ times. Complete this exercise __________ times a day. Strengthening exercises These exercises build strength and endurance in your hip and pelvis. Endurance means your muscles can keep working even when they're tired. Straight leg raises, side-lying This exercise strengthens the muscles that rotate the leg at the hip and move it away from your body. These muscles are called hip abductors. Lie on your side with your left / right leg on top. Lie so your head, shoulder, hip, and knee line up. You can bend your bottom knee to help you balance. Roll your hips slightly forward so they're stacked directly over each other. Your left / right knee should face forward. Tense the muscles in your outer thigh and hip. Lift your top leg 4-6 inches (10-15 cm) off the ground. Hold this position for __________  seconds. Slowly lower your leg back down to the starting position. Let  your muscles fully relax before doing this exercise again. Repeat __________ times. Complete this exercise __________ times a day. Leg raises, prone This exercise strengthens the muscles that move the hips backward. These muscles are called hip extensors. Lie face down (prone) on your bed or a firm surface. You can put a pillow under your hips for comfort and to support your lower back. Bend your left / right knee so your foot points straight up toward the ceiling. Keep the other leg straight and behind you. Squeeze your butt muscles. Lift your left / right thigh off the firm surface. Do not let your back arch. Tense your thigh muscle as hard as you can without having more knee pain. Hold this position for __________ seconds. Slowly lower your leg to the starting position. Allow your leg to relax all the way. Repeat __________ times. Complete this exercise __________ times a day. Hip hike  Stand sideways on a bottom step. Place your feet so that your left / right leg is on the step, and the other foot is hanging off the side. If you need support for balance, hold onto a railing or wall. Keep your knees straight and your abdomen square, meaning your hips are level. Then, lift your left / right hip up toward the ceiling. Slowly let your leg that's hanging off the step lower towards the floor. Your foot should get closer to the ground. Do not lean or bend your knees during this movement. Repeat __________ times. Complete this exercise __________ times a day. This information is not intended to replace advice given to you by your health care provider. Make sure you discuss any questions you have with your health care provider. Document Revised: 08/14/2022 Document Reviewed: 08/14/2022 [employer] Patient Education  2024 [employer] Osteoarthritis  Osteoarthritis is a type of arthritis. It refers to joint pain or joint  disease. Osteoarthritis affects tissue that covers the ends of bones in joints (cartilage). Cartilage acts as a cushion between the bones and helps them move smoothly. Osteoarthritis occurs when cartilage in the joints gets worn down. Osteoarthritis is sometimes called "wear and tear" arthritis. Osteoarthritis is the most common form of arthritis. It often occurs in older people. It is a condition that gets worse over time. The joints most often affected by this condition are in the fingers, toes, hips, knees, and spine, including the neck and lower back. What are the causes? This condition is caused by the wearing down of cartilage that covers the ends of bones. What increases the risk? The following factors may make you more likely to develop this condition: Being age 42 or older. Obesity. Overuse of joints. Past injury of a joint. Past surgery on a joint. Family history of osteoarthritis. What are the signs or symptoms? The main symptoms of this condition are pain, swelling, and stiffness in the joint. Other symptoms may include: An enlarged joint. More pain and further damage caused by small pieces of bone or cartilage that break off and float inside of the joint. Small deposits of bone (osteophytes) that grow on the edges of the joint. A grating or scraping feeling inside the joint when you move it. Popping or creaking sounds when you move. Difficulty walking or exercising. An inability to grip items, twist your hand, or control the movements of your hands and fingers. How is this diagnosed? This condition may be diagnosed based on: Your medical history. A physical exam. Your symptoms. X-rays of the affected joints. Blood tests to rule  out other types of arthritis. How is this treated? There is no cure for this condition, but treatment can help control pain and improve joint function. Treatment may include a combination of therapies, such as: Pain relief techniques, such  as: Applying heat and cold to the joint. Massage. A form of talk therapy called cognitive behavioral therapy (CBT). This therapy helps you set goals and follow up on the changes that you make. Medicines for pain and inflammation. The medicines can be taken by mouth or applied to the skin. They include: NSAIDs, such as ibuprofen. Prescription medicines. Strong anti-inflammatory medicines (corticosteroids). Certain nutritional supplements. A prescribed exercise program. You may work with a physical therapist. Assistive devices, such as a brace, wrap, splint, specialized glove, or cane. A weight control plan. Surgery, such as: An osteotomy. This is done to reposition the bones and relieve pain or to remove loose pieces of bone and cartilage. Joint replacement surgery. You may need this surgery if you have advanced osteoarthritis. Follow these instructions at home: Activity Rest your affected joints as told by your health care provider. Exercise as told by your provider. The provider may recommend specific types of exercise, such as: Strengthening exercises. These are done to strengthen the muscles that support joints affected by arthritis. Aerobic activities. These are exercises, such as brisk walking or water aerobics, that increase your heart rate. Range-of-motion activities. These help your joints move more easily. Balance and agility exercises. Managing pain, stiffness, and swelling     If told, apply heat to the affected area as often as told by your provider. Use the heat source that your provider recommends, such as a moist heat pack or a heating pad. If you have a removable assistive device, remove it as told by your provider. Place a towel between your skin and the heat source. If your provider tells you to keep the assistive device on while you apply heat, place a towel between the assistive device and the heat source. Leave the heat on for 20-30 minutes. If told, put ice on the  affected area. If you have a removable assistive device, remove it as told by your provider. Put ice in a plastic bag. Place a towel between your skin and the bag. If your provider tells you to keep the assistive device on during icing, place a towel between the assistive device and the bag. Leave the ice on for 20 minutes, 2-3 times a day. If your skin turns bright red, remove the ice or heat right away to prevent skin damage. The risk of damage is higher if you cannot feel pain, heat, or cold. Move your fingers or toes often to reduce stiffness and swelling. Raise (elevate) the affected area above the level of your heart while you are sitting or lying down. General instructions Take over-the-counter and prescription medicines only as told by your provider. Maintain a healthy weight. Follow instructions from your provider for weight control. Do not use any products that contain nicotine or tobacco. These products include cigarettes, chewing tobacco, and vaping devices, such as e-cigarettes. If you need help quitting, ask your provider. Use assistive devices as told by your provider. Where to find more information [employer] Arthritis and Musculoskeletal and Skin Diseases: niams.http://www.myers.net/ [employer] on Aging: BaseRingTones.pl [employer] of Rheumatology: rheumatology.org Contact a health care provider if: You have redness, swelling, or a feeling of warmth in a joint that gets worse. You have a fever along with joint or muscle aches. You develop  a rash. You have trouble doing your normal activities. You have pain that gets worse and is not relieved by pain medicine. This information is not intended to replace advice given to you by your health care provider. Make sure you discuss any questions you have with your health care provider. Document Revised: 01/29/2022 Document Reviewed: 01/29/2022 Else[employer] Patient Education  2024 [employer] Hip Bursitis  Hip bursitis is  swelling of one or more fluid-filled sacs (bursae) in your hip joint. If the bursa becomes irritated, it can fill with extra fluid and become swollen. This condition can cause pain, and your symptoms may come and go over time. What are the causes? Repeated use of your hip muscles. Injury to the hip. Weak butt muscles. Bone spurs. Infection. In some cases, the cause may not be known. What increases the risk? Having a past hip injury or hip surgery. Having a condition, such as arthritis, gout, diabetes, or thyroid disease. Having spine problems. Having one leg that is shorter than the other. Running a lot or doing long-distance running. Playing sports where there is a risk of injury or falling, such as football, martial arts, or skiing. What are the signs or symptoms? Symptoms may come and go, and they often include: Pain in the hip or groin area. Pain may get worse when you move your hip. Tenderness and swelling of the hip. In rare cases, the bursa may become infected. If this happens, you may: Get a fever. Have warmth and redness in the hip area. How is this treated? This condition is treated by: Resting your hip. Icing your hip. Wrapping the hip area with an elastic bandage (compression wrap). Keeping the hip raised. Other treatments may include: Using crutches, a cane, or a walker. Medicines. Draining fluid out of the bursa. Surgery to take out a bursa. This is rare. Long-term treatment may include: Doing exercises to help your strength and flexibility. Lifestyle changes like losing weight to lessen the strain on your hip. Follow these instructions at home: Managing pain, stiffness, and swelling     If told, put ice on the painful area. To do this: Put ice in a plastic bag. Place a towel between your skin and the bag. Leave the ice on for 20 minutes, 2-3 times a day. Take off the ice if your skin turns bright red. This is very important. If you cannot feel pain, heat,  or cold, you have a greater risk of damage to the area. Raise your hip by putting a pillow under your hips while you lie down. Stop if you feel pain. If told, put heat on the affected area. Do this as often as told by your doctor. Use the heat source that your doctor recommends, such as a moist heat pack or a heating pad. Place a towel between your skin and the heat source. Leave the heat on for 20-30 minutes. Take off the heat if your skin turns bright red. This is very important. If you cannot feel pain, heat, or cold, you have a greater risk of getting burned. Activity Do not use your hip to support your body weight until your doctor says that you can. Use crutches, a cane, or a walker as told by your doctor. If the affected leg is one that you use to drive, ask your doctor if it is safe to drive. Rest and protect your hip as much as you can until you feel better. Return to your normal activities when your doctor says that  it is safe. Do exercises as told by your doctor. General instructions Take over-the-counter and prescription medicines only as told by your doctor. Gently rub and stretch your injured area as often as is comfortable. Wear elastic bandages only as told by your doctor. If one of your legs is shorter than the other, get fitted for a shoe insert or orthotic. Keep a healthy weight. Follow instructions from your doctor. Keep all follow-up visits. How is this prevented? Exercise regularly or as told by your doctor. Wear the right shoes for the sport you play and for daily activities. Warm up and stretch before being active. Cool down and stretch after being active. Take breaks often from repeated activity. Avoid activities that bother your hip or cause pain. Avoid sitting down for a long time. Where to find more information [employer]: orthoinfo.aaos.org Contact a doctor if: You have a fever. You have new symptoms. You have trouble walking  or doing everyday activities. You have pain that gets worse or does not get better with medicine. The skin around your hip is red. You get a feeling of warmth in your hip area. Get help right away if: You cannot move your hip. You have very bad pain. You cannot control the muscles in your feet. Summary Hip bursitis is swelling of one or more fluid-filled sacs (bursae) in your hip joint. Symptoms often come and go over time. This condition is often treated by resting and icing the hip. It also may help to keep the area raised and wrapped in an elastic bandage. Other treatments may be needed. This information is not intended to replace advice given to you by your health care provider. Make sure you discuss any questions you have with your health care provider. Document Revised: 05/27/2021 Document Reviewed: 05/27/2021 Elsevier Patient Education  2024 [employer].

## 2023-07-30 NOTE — Telephone Encounter (Signed)
We will be placing a referral for physical therapy for osteoarthritis of hands per Dr. Corliss Skains.  The patient's requested location is [place]. The patient is going to discuss an exact location with her husband and friend who is a physical therapist. She will send [place] a MyChart message with exact location so we can place a referral.

## 2023-08-01 LAB — COMPLETE METABOLIC PANEL WITH GFR
AG Ratio: 1.6 (calc) (ref 1.0–2.5)
ALT: 28 U/L (ref 6–29)
AST: 20 U/L (ref 10–35)
Albumin: 4.5 g/dL (ref 3.6–5.1)
Alkaline phosphatase (APISO): 62 U/L (ref 37–153)
BUN: 21 mg/dL (ref 7–25)
CO2: 27 mmol/L (ref 20–32)
Calcium: 9.2 mg/dL (ref 8.6–10.4)
Chloride: 102 mmol/L (ref 98–110)
Creat: 0.96 mg/dL (ref 0.50–1.03)
Globulin: 2.8 g/dL (ref 1.9–3.7)
Glucose, Bld: 78 mg/dL (ref 65–99)
Potassium: 4.3 mmol/L (ref 3.5–5.3)
Sodium: 138 mmol/L (ref 135–146)
Total Bilirubin: 0.7 mg/dL (ref 0.2–1.2)
Total Protein: 7.3 g/dL (ref 6.1–8.1)
eGFR: 71 mL/min/{1.73_m2} (ref >=60)

## 2023-08-01 LAB — RNP ANTIBODY: Ribonucleic Protein(ENA) Antibody, IgG: <1 AI

## 2023-08-01 LAB — SJOGRENS SYNDROME-B EXTRACTABLE NUCLEAR ANTIBODY: SSB (La) (ENA) Antibody, IgG: <1 AI

## 2023-08-01 LAB — ANTI-DNA ANTIBODY, DOUBLE-STRANDED: ds DNA Ab: 8 [IU]/mL — ABNORMAL HIGH

## 2023-08-01 LAB — CBC WITH DIFFERENTIAL/PLATELET
Absolute Lymphocytes: 1529 {cells}/uL (ref 850–3900)
Absolute Monocytes: 468 {cells}/uL (ref 200–950)
Basophils Absolute: 52 {cells}/uL (ref 0–200)
Basophils Relative: 1 %
Eosinophils Absolute: 239 {cells}/uL (ref 15–500)
Eosinophils Relative: 4.6 %
HCT: 42.1 % (ref 35.0–45.0)
Hemoglobin: 14 g/dL (ref 11.7–15.5)
MCH: 29.7 pg (ref 27.0–33.0)
MCHC: 33.3 g/dL (ref 32.0–36.0)
MCV: 89.4 fL (ref 80.0–100.0)
MPV: 12.1 fL (ref 7.5–12.5)
Monocytes Relative: 9 %
Neutro Abs: 2912 {cells}/uL (ref 1500–7800)
Neutrophils Relative %: 56 %
Platelets: 225 Thousand/uL (ref 140–400)
RBC: 4.71 Million/uL (ref 3.80–5.10)
RDW: 12.1 % (ref 11.0–15.0)
Total Lymphocyte: 29.4 %
WBC: 5.2 Thousand/uL (ref 3.8–10.8)

## 2023-08-01 LAB — SJOGRENS SYNDROME-A EXTRACTABLE NUCLEAR ANTIBODY: SSA (Ro) (ENA) Antibody, IgG: <1 AI

## 2023-08-01 LAB — PROTEIN / CREATININE RATIO, URINE
Creatinine, Urine: 30 mg/dL (ref 20–275)
Total Protein, Urine: <4 mg/dL — ABNORMAL LOW (ref 5–24)

## 2023-08-01 LAB — ANA: Anti Nuclear Antibody (ANA): POSITIVE — AB

## 2023-08-01 LAB — ANTI-NUCLEAR AB-TITER (ANA TITER): ANA Titer 1: 1:40 {titer} — ABNORMAL HIGH

## 2023-08-01 LAB — C3 AND C4
C3 Complement: 126 mg/dL (ref 83–193)
C4 Complement: 29 mg/dL (ref 15–57)

## 2023-08-01 LAB — ANTI-SCLERODERMA ANTIBODY: Scleroderma (Scl-70) (ENA) Antibody, IgG: <1 AI

## 2023-08-01 LAB — ANTI-SMITH ANTIBODY: ENA SM Ab Ser-aCnc: <1 AI

## 2023-08-01 NOTE — Progress Notes (Signed)
ANA is low titer positive, double-stranded DNA is indeterminate.  All other labs are within normal limits.  I will discuss results at the follow-up visit.

## 2023-08-06 ENCOUNTER — Encounter: Payer: Self-pay | Admitting: Rheumatology

## 2023-08-06 DIAGNOSIS — M79641 Pain in right hand: Secondary | ICD-10-CM

## 2023-08-06 NOTE — Telephone Encounter (Signed)
Attempted to contact the patient and left message to advise patient to contact the office to leave [place] the name of the location she would like to go to for PT.

## 2023-08-09 NOTE — Telephone Encounter (Signed)
 Referral placed.

## 2023-08-13 NOTE — Progress Notes (Signed)
 Office Visit Note  Patient: Laura Spencer             Date of Birth: 12-Dec-1970           MRN: [id number]             PCP: Mort Sawyers, FNP Referring: Mort Sawyers, FNP Visit Date: 08/27/2023 Occupation: @GUAROCC @  Subjective:  Pain in joints and positive ANA  History of Present Illness: Laura Spencer is a 53 y.o. female with positive ANA and polyarthralgia.  She returns for follow-up visit.  She states she continues to have pain and discomfort in the bilateral hands, knee joints, shoulders, and feet.  She states she got some orthotics which were helpful.  She is starting physical therapy for her hands.  She has not noticed any joint swelling.  She denies any history of oral ulcers, nasal ulcers, malar rash, photosensitivity, Raynaud's, lymphadenopathy or inflammatory arthritis.    Activities of Daily Living:  Patient reports morning stiffness for 30-60 minutes.   Patient Reports nocturnal pain.  Difficulty dressing/grooming: Reports Difficulty climbing stairs: Reports Difficulty getting out of chair: Reports Difficulty using hands for taps, buttons, cutlery, and/or writing: Reports  Review of Systems  Constitutional:  Negative for fatigue.  HENT:  Negative for mouth sores and mouth dryness.   Eyes:  Negative for dryness.  Respiratory:  Negative for shortness of breath.   Cardiovascular:  Negative for chest pain and palpitations.  Gastrointestinal:  Negative for blood in stool, constipation and diarrhea.  Endocrine: Negative for increased urination.  Genitourinary:  Negative for involuntary urination.  Musculoskeletal:  Positive for joint pain, joint pain, joint swelling, muscle weakness and morning stiffness. Negative for myalgias, muscle tenderness and myalgias.  Skin:  Negative for color change, rash, hair loss and sensitivity to sunlight.  Allergic/Immunologic: Negative for susceptible to infections.  Neurological:  Negative for dizziness and headaches.   Hematological:  Negative for swollen glands.  Psychiatric/Behavioral:  Negative for depressed mood and sleep disturbance. The patient is not nervous/anxious.     PMFS History:  Patient Active Problem List   Diagnosis Date Noted   Menopause 02/19/2023   Bilateral bunions 10/08/2022   Plantar fasciitis 10/08/2022   Elevated LFTs 10/08/2022   Non-seasonal allergic rhinitis 07/13/2022   Iron deficiency anemia 07/13/2022   Keratosis pilaris 07/13/2022   Vertigo 07/13/2022   Plantar wart 09/06/2015   Migraine 07/19/2015   Hypothyroidism 06/14/2012    Past Medical History:  Diagnosis Date   Anemia    Anxiety    Attention deficit hyperactivity disorder    Complication of anesthesia    Depression    Hashimoto's disease    Headache    hx of migraines   Hypothyroidism    PONV (postoperative nausea and vomiting)    Vertigo 02/2022    Family History  Problem Relation Age of Onset   Mental illness Mother    Depression Mother    Anxiety disorder Mother    Other Mother        hx of ankle surgery   Thyroid disease Mother    Angina Father    Heart disease Father        bypass h/o   Healthy Son    Epilepsy Son    ADD / ADHD Son    Thyroid disease Half-Sister    Obesity Half-Sister    Bipolar disorder Half-Sister    Breast cancer Neg Hx    Ovarian cancer Neg Hx    Colon cancer Neg  Hx    Endometrial cancer Neg Hx    Past Surgical History:  Procedure Laterality Date   CESAREAN SECTION     x2   COLONOSCOPY     ROBOTIC ASSISTED SALPINGO OOPHERECTOMY N/A 06/23/2022   Procedure: XI ROBOTIC ASSISTED RIGHT SALPINGO OOPHORECTOMY, LEFT SALPINGOECTOMY;  Surgeon: Clide Cliff, MD;  Location: [facility];  Service: Gynecology;  Laterality: N/A;   Social History   Social History Narrative   Not on file   Immunization History  Administered Date(s) Administered   Influenza, Seasonal, Injecte, Preservative Fre 02/19/2023   Influenza,inj,Quad PF,6+ Mos 07/26/2015    Influenza-Unspecified 06/14/2012   PFIZER(Purple Top)SARS-COV-2 Vaccination 08/16/2019, 09/13/2019, 05/20/2020, 11/29/2020, 06/20/2021   Tdap 07/26/2015     Objective: Vital Signs: BP 123/80 (BP Location: Left Arm, Patient Position: Sitting, Cuff Size: Normal)   Pulse 73   Resp 14   Ht 5\' 6"  (1.676 m)   Wt 181 lb (82.1 kg)   BMI 29.21 kg/m    Physical Exam Vitals and nursing note reviewed.  Constitutional:      Appearance: She is well-developed.  HENT:     Head: Normocephalic and atraumatic.  Eyes:     Conjunctiva/sclera: Conjunctivae normal.  Cardiovascular:     Rate and Rhythm: Normal rate and regular rhythm.     Heart sounds: Normal heart sounds.  Pulmonary:     Effort: Pulmonary effort is normal.     Breath sounds: Normal breath sounds.  Abdominal:     General: Bowel sounds are normal.     Palpations: Abdomen is soft.  Musculoskeletal:     Cervical back: Normal range of motion.  Lymphadenopathy:     Cervical: No cervical adenopathy.  Skin:    General: Skin is warm and dry.     Capillary Refill: Capillary refill takes less than 2 seconds.  Neurological:     Mental Status: She is alert and oriented to person, place, and time.  Psychiatric:        Behavior: Behavior normal.      Musculoskeletal Exam: She has stiffness with range of motion of the cervical spine.  Thoracic and lumbar spine with good range of motion.  She had no SI joint tenderness.  Shoulders were in good range of motion with some discomfort.  Elbow joints and wrist joints were in good range of motion.  She had tenderness over bilateral CMC, PIP and DIP.  Bilateral middle finger DIP joint subluxation was noted.  Hip joints and knee joints were in good range of motion without any warmth swelling or effusion.  There was no tenderness over ankles or MTPs.  As she had bilateral first MTP thickening, PIP and DIP thickening with no synovitis.  No plantar fasciitis or Achilles tendinitis was noted.  CDAI  Exam: CDAI Score: -- Patient Global: --; Provider Global: -- Swollen: --; Tender: -- Joint Exam 08/27/2023   No joint exam has been documented for this visit   There is currently no information documented on the homunculus. Go to the Rheumatology activity and complete the homunculus joint exam.  Investigation: No additional findings.  Imaging: XR KNEE 3 VIEW LEFT Result Date: 07/30/2023 No medial or lateral compartment narrowing was noted.  No patellofemoral narrowing was noted.  No chondrocalcinosis was noted. Impression: Unremarkable x-rays of the knee.  XR Hand 2 View Left Result Date: 07/30/2023 Severe CMC narrowing and subluxation was noted.  PIP and DIP narrowing was noted.  Second DIP subluxation was noted.  No MCP, intercarpal or  radiocarpal joint space narrowing was noted.  No erosive changes were noted. Impression: These findings were suggestive of osteoarthritis of the hand.  XR Hand 2 View Right Result Date: 07/30/2023 Severe CMC narrowing and subluxation was noted.  PIP and DIP narrowing was noted.  Second DIP subluxation was noted.  No MCP, intercarpal or radiocarpal joint space narrowing was noted.  No erosive changes were noted. Impression: These findings were suggestive of osteoarthritis of the hand.   Recent Labs: Lab Results  Component Value Date   WBC 5.2 07/30/2023   HGB 14.0 07/30/2023   PLT 225 07/30/2023   NA 138 07/30/2023   K 4.3 07/30/2023   CL 102 07/30/2023   CO2 27 07/30/2023   GLUCOSE 78 07/30/2023   BUN 21 07/30/2023   CREATININE 0.96 07/30/2023   BILITOT 0.7 07/30/2023   ALKPHOS 69 02/19/2023   AST 20 07/30/2023   ALT 28 07/30/2023   PROT 7.3 07/30/2023   ALBUMIN 4.3 02/19/2023   CALCIUM 9.2 07/30/2023   July 30, 2023 urine protein creatinine ratio normal, C3-C4 normal, ANA 1: 40 NS, dsDNA 8, (SCL 70, RNP, Smith, SSA, SSB negative)  02/19/23: ANA 1:80NS, TSH 4.26, ESR 15, RF<10 -her ANA is low titer positive. She gives history of  fatigue and joint pain   Speciality Comments: No specialty comments available.  Procedures:  No procedures performed Allergies: Patient has no known allergies.   Assessment / Plan:     Visit Diagnoses: Polyarthralgia-she continues to have pain and discomfort in multiple joints involving her shoulders, hands, knees and her feet.  No synovitis was noted on the examination.  Positive ANA (antinuclear antibody) -she has low titer positive ANA and double-stranded DNA was indeterminate.  Patient has no clinical features of autoimmune disease at this time.  I advised her to have repeat labs in 6 months prior to her next visit.  Plan: Protein / creatinine ratio, urine, CBC with Differential/Platelet, COMPLETE METABOLIC PANEL WITH GFR, ANA, Anti-DNA antibody, double-stranded, C3 and C4, Sedimentation rate  Pain in shoulders-she complains of discomfort in her shoulders.  She has good range of motion.  A handout on shoulder exercises was given.  Primary osteoarthritis of both hands - Bilateral CMC, PIP and DIP narrowing was noted.  X-rays obtained at the last visit were reviewed.  She has severe osteoarthritis in her hands.  Joint protection muscle strengthening was discussed.  A handout on exercises was given.  Patient was referred to physical therapy.  Will be starting physical therapy soon.  She was also advised to get bilateral middle finger ring splints for the DIP joints.  Trochanteric bursitis, left hip - A handout on IT band stretches was given.  I did when tightness is better.  Chronic pain of left knee -continues to have some discomfort in her knee joints.  No warmth swelling or effusion was noted.  X-rays obtained at the last visit were unremarkable.  X-rays were reviewed with the patient.  A handout on knee exercises was given.  Patient was referred to physical therapy.  Primary osteoarthritis of both feet -she continues to have pain and discomfort in her both feet.  Patient had bilateral  bunions.  Severe PIP and DIP narrowing was noted.  She got some orthotics which have been helpful.  Plantar fasciitis-she is intermittent plantar fasciitis.  Other medical problems are listed as follows:  Elevated LFTs - improving  History of iron deficiency anemia  Hypothyroidism due to Hashimoto thyroiditis  Hx of migraines  Anxiety and depression - in remission  Attention deficit hyperactivity disorder (ADHD), combined type  Keratosis pilaris  Vertigo  Orders: Orders Placed This Encounter  Procedures   Protein / creatinine ratio, urine   CBC with Differential/Platelet   COMPLETE METABOLIC PANEL WITH GFR   ANA   Anti-DNA antibody, double-stranded   C3 and C4   Sedimentation rate   No orders of the defined types were placed in this encounter.   Face-to-face time spent with patient was over 30 minutes. Greater than 50% of time was spent in counseling and coordination of care.  Follow-Up Instructions: No follow-ups on file.   Pollyann Savoy, MD  Note - This record has been created using Animal nutritionist.  Chart creation errors have been sought, but may not always  have been located. Such creation errors do not reflect on  the standard of medical care.

## 2023-08-27 ENCOUNTER — Encounter: Payer: Self-pay | Admitting: Rheumatology

## 2023-08-27 ENCOUNTER — Ambulatory Visit: Payer: BC Managed Care – PPO | Attending: Rheumatology | Admitting: Rheumatology

## 2023-08-27 VITALS — BP 123/80 | HR 73 | Resp 14 | Ht 66.0 in | Wt 181.0 lb

## 2023-08-27 DIAGNOSIS — F902 Attention-deficit hyperactivity disorder, combined type: Secondary | ICD-10-CM

## 2023-08-27 DIAGNOSIS — E063 Autoimmune thyroiditis: Secondary | ICD-10-CM

## 2023-08-27 DIAGNOSIS — M7062 Trochanteric bursitis, left hip: Secondary | ICD-10-CM

## 2023-08-27 DIAGNOSIS — M25512 Pain in left shoulder: Secondary | ICD-10-CM

## 2023-08-27 DIAGNOSIS — F32A Depression, unspecified: Secondary | ICD-10-CM

## 2023-08-27 DIAGNOSIS — L858 Other specified epidermal thickening: Secondary | ICD-10-CM

## 2023-08-27 DIAGNOSIS — Z862 Personal history of diseases of the blood and blood-forming organs and certain disorders involving the immune mechanism: Secondary | ICD-10-CM

## 2023-08-27 DIAGNOSIS — R7989 Other specified abnormal findings of blood chemistry: Secondary | ICD-10-CM

## 2023-08-27 DIAGNOSIS — R768 Other specified abnormal immunological findings in serum: Secondary | ICD-10-CM

## 2023-08-27 DIAGNOSIS — M19041 Primary osteoarthritis, right hand: Secondary | ICD-10-CM

## 2023-08-27 DIAGNOSIS — M25562 Pain in left knee: Secondary | ICD-10-CM

## 2023-08-27 DIAGNOSIS — M25511 Pain in right shoulder: Secondary | ICD-10-CM | POA: Diagnosis not present

## 2023-08-27 DIAGNOSIS — Z8669 Personal history of other diseases of the nervous system and sense organs: Secondary | ICD-10-CM

## 2023-08-27 DIAGNOSIS — M2550 Pain in unspecified joint: Secondary | ICD-10-CM

## 2023-08-27 DIAGNOSIS — M19072 Primary osteoarthritis, left ankle and foot: Secondary | ICD-10-CM

## 2023-08-27 DIAGNOSIS — M19042 Primary osteoarthritis, left hand: Secondary | ICD-10-CM

## 2023-08-27 DIAGNOSIS — R42 Dizziness and giddiness: Secondary | ICD-10-CM

## 2023-08-27 DIAGNOSIS — G8929 Other chronic pain: Secondary | ICD-10-CM

## 2023-08-27 DIAGNOSIS — M722 Plantar fascial fibromatosis: Secondary | ICD-10-CM

## 2023-08-27 DIAGNOSIS — F419 Anxiety disorder, unspecified: Secondary | ICD-10-CM

## 2023-08-27 DIAGNOSIS — M19071 Primary osteoarthritis, right ankle and foot: Secondary | ICD-10-CM

## 2023-08-27 NOTE — Patient Instructions (Addendum)
Hand Exercises Hand exercises can be helpful for almost anyone. They can strengthen your hands and improve flexibility and movement. The exercises can also increase blood flow to the hands. These results can make your work and daily tasks easier for you. Hand exercises can be especially helpful for people who have joint pain from arthritis or nerve damage from using their hands over and over. These exercises can also help people who injure a hand. Exercises Most of these hand exercises are gentle stretching and motion exercises. It is usually safe to do them often throughout the day. Warming up your hands before exercise may help reduce stiffness. You can do this with gentle massage or by placing your hands in warm water for 10-15 minutes. It is normal to feel some stretching, pulling, tightness, or mild discomfort when you begin new exercises. In time, this will improve. Remember to always be careful and stop right away if you feel sudden, very bad pain or your pain gets worse. You want to get better and be safe. Ask your health care provider which exercises are safe for you. Do exercises exactly as told by your provider and adjust them as told. Do not begin these exercises until told by your provider. Knuckle bend or "claw" fist  Stand or sit with your arm, hand, and all five fingers pointed straight up. Make sure to keep your wrist straight. Gently bend your fingers down toward your palm until the tips of your fingers are touching your palm. Keep your big knuckle straight and only bend the small knuckles in your fingers. Hold this position for 10 seconds. Straighten your fingers back to your starting position. Repeat this exercise 5-10 times with each hand. Full finger fist  Stand or sit with your arm, hand, and all five fingers pointed straight up. Make sure to keep your wrist straight. Gently bend your fingers into your palm until the tips of your fingers are touching the middle of your  palm. Hold this position for 10 seconds. Extend your fingers back to your starting position, stretching every joint fully. Repeat this exercise 5-10 times with each hand. Straight fist  Stand or sit with your arm, hand, and all five fingers pointed straight up. Make sure to keep your wrist straight. Gently bend your fingers at the big knuckle, where your fingers meet your hand, and at the middle knuckle. Keep the knuckle at the tips of your fingers straight and try to touch the bottom of your palm. Hold this position for 10 seconds. Extend your fingers back to your starting position, stretching every joint fully. Repeat this exercise 5-10 times with each hand. Tabletop  Stand or sit with your arm, hand, and all five fingers pointed straight up. Make sure to keep your wrist straight. Gently bend your fingers at the big knuckle, where your fingers meet your hand, as far down as you can. Keep the small knuckles in your fingers straight. Think of forming a tabletop with your fingers. Hold this position for 10 seconds. Extend your fingers back to your starting position, stretching every joint fully. Repeat this exercise 5-10 times with each hand. Finger spread  Place your hand flat on a table with your palm facing down. Make sure your wrist stays straight. Spread your fingers and thumb apart from each other as far as you can until you feel a gentle stretch. Hold this position for 10 seconds. Bring your fingers and thumb tight together again. Hold this position for 10 seconds. Repeat  this exercise 5-10 times with each hand. Making circles  Stand or sit with your arm, hand, and all five fingers pointed straight up. Make sure to keep your wrist straight. Make a circle by touching the tip of your thumb to the tip of your index finger. Hold for 10 seconds. Then open your hand wide. Repeat this motion with your thumb and each of your fingers. Repeat this exercise 5-10 times with each hand. Thumb  motion  Sit with your forearm resting on a table and your wrist straight. Your thumb should be facing up toward the ceiling. Keep your fingers relaxed as you move your thumb. Lift your thumb up as high as you can toward the ceiling. Hold for 10 seconds. Bend your thumb across your palm as far as you can, reaching the tip of your thumb for the small finger (pinkie) side of your palm. Hold for 10 seconds. Repeat this exercise 5-10 times with each hand. Grip strengthening  Hold a stress ball or other soft ball in the middle of your hand. Slowly increase the pressure, squeezing the ball as much as you can without causing pain. Think of bringing the tips of your fingers into the middle of your palm. All of your finger joints should bend when doing this exercise. Hold your squeeze for 10 seconds, then relax. Repeat this exercise 5-10 times with each hand. Contact a health care provider if: Your hand pain or discomfort gets much worse when you do an exercise. Your hand pain or discomfort does not improve within 2 hours after you exercise. If you have either of these problems, stop doing these exercises right away. Do not do them again unless your provider says that you can. Get help right away if: You develop sudden, severe hand pain or swelling. If this happens, stop doing these exercises right away. Do not do them again unless your provider says that you can. This information is not intended to replace advice given to you by your health care provider. Make sure you discuss any questions you have with your health care provider. Document Revised: 06/16/2022 Document Reviewed: 06/16/2022 [employer] Patient Education  2024 [employer] Exercises for Chronic Knee Pain Chronic knee pain is pain that lasts longer than 3 months. For most people with chronic knee pain, exercise and weight loss is an important part of treatment. Your health care provider may want you to focus on: Making the muscles that  support your knee stronger. This can take pressure off your knee and reduce pain. Preventing knee stiffness. How far you can move your knee, keeping it there or making it farther. Losing weight (if this applies) to take pressure off your knee, lower your risk for injury, and make it easier for you to exercise. Your provider will help you make an exercise program that fits your needs and physical abilities. Below are simple, low-impact exercises you can do at home. Ask your provider or physical therapist how often you should do your exercise program and how many times to repeat each exercise. General safety tips  Get your provider's approval before doing any exercises. Start slowly and stop any time you feel pain. Do not exercise if your knee pain is flaring up. Warm up first. Stretching a cold muscle can cause an injury. Do 5-10 minutes of easy movement or light stretching before beginning your exercises. Do 5-10 minutes of low-impact activity (like walking or cycling) before starting strengthening exercises. Contact your provider any time you have pain during  or after exercising. Exercise can cause discomfort but should not be painful. It is normal to be a little stiff or sore after exercising. Stretching and range-of-motion exercises Front thigh stretch  Stand up straight and support your body by holding on to a chair or resting one hand on a wall. With your legs straight and close together, bend one knee to lift your heel up toward your butt. Using one hand for support, grab your ankle with your free hand. Pull your foot up closer toward your butt to feel the stretch in front of your thigh. Hold the stretch for 30 seconds. Repeat __________ times. Complete this exercise __________ times a day. Back thigh stretch  Sit on the floor with your back straight and your legs out straight in front of you. Place the palms of your hands on the floor and slide them toward your feet as you bend at the  hip. Try to touch your nose to your knees and feel the stretch in the back of your thighs. Hold for 30 seconds. Repeat __________ times. Complete this exercise __________ times a day. Calf stretch  Stand facing a wall. Place the palms of your hands flat against the wall, arms extended, and lean slightly against the wall. Get into a lunge position with one leg bent at the knee and the other leg stretched out straight behind you. Keep both feet facing the wall and increase the bend in your knee while keeping the heel of the other leg flat on the ground. You should feel the stretch in your calf. Hold for 30 seconds. Repeat __________ times. Complete this exercise __________ times a day. Strengthening exercises Straight leg lift  Lie on your back with one knee bent and the other leg out straight. Slowly lift the straight leg without bending the knee. Lift until your foot is about 12 inches (30 cm) off the floor. Hold for 3-5 seconds and slowly lower your leg. Repeat __________ times. Complete this exercise __________ times a day. Single leg dip  Stand between two chairs and put both hands on the backs of the chairs for support. Extend one leg out straight with your body weight resting on the heel of the standing leg. Slowly bend your standing knee to dip your body to the level that is comfortable for you. Hold for 3-5 seconds. Repeat __________ times. Complete this exercise __________ times a day. Hamstring curls  Stand straight, knees close together, facing the back of a chair. Hold on to the back of a chair with both hands. Keep one leg straight. Bend the other knee while bringing the heel up toward the butt until the knee is bent at a 90-degree angle (right angle). Hold for 3-5 seconds. Repeat __________ times. Complete this exercise __________ times a day. Wall squat  Stand straight with your back, hips, and head against a wall. Step forward one foot at a time with your back  still against the wall. Your feet should be 2 feet (61 cm) from the wall at shoulder width. Keeping your back, hips, and head against the wall, slide down the wall to as close to a sitting position as you can get. Hold for 5-10 seconds, then slowly slide back up. Repeat __________ times. Complete this exercise __________ times a day. Step-ups  Stand in front of a sturdy platform or stool that is about 6 inches (15 cm) high. Slowly step up with your left / right foot, keeping your knee in line with your hip  and foot. Do not let your knee bend so far that you cannot see your toes. Hold on to a chair for balance, but do not use it for support. Slowly unlock your knee and lower yourself to the starting position. Repeat __________ times. Complete this exercise __________ times a day. Contact a health care provider if: Your exercises cause pain. Your pain is worse after you exercise. Your pain prevents you from doing your exercises. This information is not intended to replace advice given to you by your health care provider. Make sure you discuss any questions you have with your health care provider. Document Revised: 06/16/2022 Document Reviewed: 06/16/2022 [employer] Patient Education  2024 [employer] Shoulder Exercises Ask your health care provider which exercises are safe for you. Do exercises exactly as told by your health care provider and adjust them as directed. It is normal to feel mild stretching, pulling, tightness, or discomfort as you do these exercises. Stop right away if you feel sudden pain or your pain gets worse. Do not begin these exercises until told by your health care provider.   Stretching exercises External rotation and abduction This exercise is sometimes called corner stretch. The exercise rotates your arm outward (external rotation) and moves your arm out from your body (abduction). Stand in a doorway with one of your feet slightly in front of the other. This is called  a staggered stance. If you cannot reach your forearms to the door frame, stand facing a corner of a room. Choose one of the following positions as told by your health care provider: Place your hands and forearms on the door frame above your head. Place your hands and forearms on the door frame at the height of your head. Place your hands on the door frame at the height of your elbows. Slowly move your weight onto your front foot until you feel a stretch across your chest and in the front of your shoulders. Keep your head and chest upright and keep your abdominal muscles tight. Hold for __________ seconds. To release the stretch, shift your weight to your back foot. Repeat __________ times. Complete this exercise __________ times a day. Extension, standing  Stand and hold a broomstick, a cane, or a similar object behind your back. Your hands should be a little wider than shoulder-width apart. Your palms should face away from your back. Keeping your elbows straight and your shoulder muscles relaxed, move the stick away from your body until you feel a stretch in your shoulders (extension). Avoid shrugging your shoulders while you move the stick. Keep your shoulder blades tucked down toward the middle of your back. Hold for __________ seconds. Slowly return to the starting position. Repeat __________ times. Complete this exercise __________ times a day. Range-of-motion exercises Pendulum  Stand near a wall or a surface that you can hold onto for balance. Bend at the waist and let your left / right arm hang straight down. Use your other arm to support you. Keep your back straight and do not lock your knees. Relax your left / right arm and shoulder muscles, and move your hips and your trunk so your left / right arm swings freely. Your arm should swing because of the motion of your body, not because you are using your arm or shoulder muscles. Keep moving your hips and trunk so your arm swings in  the following directions, as told by your health care provider: Side to side. Forward and backward. In clockwise and counterclockwise circles. Continue  each motion for __________ seconds, or for as long as told by your health care provider. Slowly return to the starting position. Repeat __________ times. Complete this exercise __________ times a day. Shoulder flexion, standing  Stand and hold a broomstick, a cane, or a similar object. Place your hands a little more than shoulder-width apart on the object. Your left / right hand should be palm-up, and your other hand should be palm-down. Keep your elbow straight and your shoulder muscles relaxed. Push the stick up with your healthy arm to raise your left / right arm in front of your body, and then over your head until you feel a stretch in your shoulder (flexion). Avoid shrugging your shoulder while you raise your arm. Keep your shoulder blade tucked down toward the middle of your back. Hold for __________ seconds. Slowly return to the starting position. Repeat __________ times. Complete this exercise __________ times a day. Shoulder abduction, standing  Stand and hold a broomstick, a cane, or a similar object. Place your hands a little more than shoulder-width apart on the object. Your left / right hand should be palm-up, and your other hand should be palm-down. Keep your elbow straight and your shoulder muscles relaxed. Push the object across your body toward your left / right side. Raise your left / right arm to the side of your body (abduction) until you feel a stretch in your shoulder. Do not raise your arm above shoulder height unless your health care provider tells you to do that. If directed, raise your arm over your head. Avoid shrugging your shoulder while you raise your arm. Keep your shoulder blade tucked down toward the middle of your back. Hold for __________ seconds. Slowly return to the starting position. Repeat __________  times. Complete this exercise __________ times a day. Internal rotation  Place your left / right hand behind your back, palm-up. Use your other hand to dangle an exercise band, a broomstick, or a similar object over your shoulder. Grasp the band with your left / right hand so you are holding on to both ends. Gently pull up on the band until you feel a stretch in the front of your left / right shoulder. The movement of your arm toward the center of your body is called internal rotation. Avoid shrugging your shoulder while you raise your arm. Keep your shoulder blade tucked down toward the middle of your back. Hold for __________ seconds. Release the stretch by letting go of the band and lowering your hands. Repeat __________ times. Complete this exercise __________ times a day. Strengthening exercises External rotation  Sit in a stable chair without armrests. Secure an exercise band to a stable object at elbow height on your left / right side. Place a soft object, such as a folded towel or a small pillow, between your left / right upper arm and your body to move your elbow about 4 inches (10 cm) away from your side. Hold the end of the exercise band so it is tight and there is no slack. Keeping your elbow pressed against the soft object, slowly move your forearm out, away from your abdomen (external rotation). Keep your body steady so only your forearm moves. Hold for __________ seconds. Slowly return to the starting position. Repeat __________ times. Complete this exercise __________ times a day. Shoulder abduction  Sit in a stable chair without armrests, or stand up. Hold a __________ lb / kg weight in your left / right hand, or hold an exercise  band with both hands. Start with your arms straight down and your left / right palm facing in, toward your body. Slowly lift your left / right hand out to your side (abduction). Do not lift your hand above shoulder height unless your health care  provider tells you that this is safe. Keep your arms straight. Avoid shrugging your shoulder while you do this movement. Keep your shoulder blade tucked down toward the middle of your back. Hold for __________ seconds. Slowly lower your arm, and return to the starting position. Repeat __________ times. Complete this exercise __________ times a day. Shoulder extension  Sit in a stable chair without armrests, or stand up. Secure an exercise band to a stable object in front of you so it is at shoulder height. Hold one end of the exercise band in each hand. Straighten your elbows and lift your hands up to shoulder height. Squeeze your shoulder blades together as you pull your hands down to the sides of your thighs (extension). Stop when your hands are straight down by your sides. Do not let your hands go behind your body. Hold for __________ seconds. Slowly return to the starting position. Repeat __________ times. Complete this exercise __________ times a day. Shoulder row  Sit in a stable chair without armrests, or stand up. Secure an exercise band to a stable object in front of you so it is at chest height. Hold one end of the exercise band in each hand. Position your palms so that your thumbs are facing the ceiling (neutral position). Bend each of your elbows to a 90-degree angle (right angle) and keep your upper arms at your sides. Step back or move the chair back until the band is tight and there is no slack. Slowly pull your elbows back behind you. Hold for __________ seconds. Slowly return to the starting position. Repeat __________ times. Complete this exercise __________ times a day. Shoulder press-ups  Sit in a stable chair that has armrests. Sit upright, with your feet flat on the floor. Put your hands on the armrests so your elbows are bent and your fingers are pointing forward. Your hands should be about even with the sides of your body. Push down on the armrests and use your  arms to lift yourself off the chair. Straighten your elbows and lift yourself up as much as you comfortably can. Move your shoulder blades down, and avoid letting your shoulders move up toward your ears. Keep your feet on the ground. As you get stronger, your feet should support less of your body weight as you lift yourself up. Hold for __________ seconds. Slowly lower yourself back into the chair. Repeat __________ times. Complete this exercise __________ times a day. Wall push-ups  Stand so you are facing a stable wall. Your feet should be about one arm-length away from the wall. Lean forward and place your palms on the wall at shoulder height. Keep your feet flat on the floor as you bend your elbows and lean forward toward the wall. Hold for __________ seconds. Straighten your elbows to push yourself back to the starting position. Repeat __________ times. Complete this exercise __________ times a day. This information is not intended to replace advice given to you by your health care provider. Make sure you discuss any questions you have with your health care provider. Document Revised: 07/22/2021 Document Reviewed: 07/22/2021 [employer] Patient Education  2024 [employer].

## 2023-08-30 DIAGNOSIS — M2022 Hallux rigidus, left foot: Secondary | ICD-10-CM | POA: Diagnosis not present

## 2023-08-30 DIAGNOSIS — M79672 Pain in left foot: Secondary | ICD-10-CM | POA: Diagnosis not present

## 2023-08-30 DIAGNOSIS — M79671 Pain in right foot: Secondary | ICD-10-CM | POA: Diagnosis not present

## 2023-08-30 DIAGNOSIS — M2021 Hallux rigidus, right foot: Secondary | ICD-10-CM | POA: Diagnosis not present

## 2023-08-30 NOTE — Telephone Encounter (Signed)
 Referral was for PT for bil hands.

## 2023-08-30 NOTE — Telephone Encounter (Signed)
 I called patient, patient will let us know if she needs a referral.

## 2023-08-31 DIAGNOSIS — F411 Generalized anxiety disorder: Secondary | ICD-10-CM | POA: Diagnosis not present

## 2023-08-31 DIAGNOSIS — F4323 Adjustment disorder with mixed anxiety and depressed mood: Secondary | ICD-10-CM | POA: Diagnosis not present

## 2023-08-31 DIAGNOSIS — F902 Attention-deficit hyperactivity disorder, combined type: Secondary | ICD-10-CM | POA: Diagnosis not present

## 2023-08-31 DIAGNOSIS — F331 Major depressive disorder, recurrent, moderate: Secondary | ICD-10-CM | POA: Diagnosis not present

## 2023-09-20 DIAGNOSIS — M79645 Pain in left finger(s): Secondary | ICD-10-CM | POA: Diagnosis not present

## 2023-09-20 DIAGNOSIS — M79674 Pain in right toe(s): Secondary | ICD-10-CM | POA: Diagnosis not present

## 2023-09-20 DIAGNOSIS — M79644 Pain in right finger(s): Secondary | ICD-10-CM | POA: Diagnosis not present

## 2023-09-20 DIAGNOSIS — M25562 Pain in left knee: Secondary | ICD-10-CM | POA: Diagnosis not present

## 2023-09-24 DIAGNOSIS — M79644 Pain in right finger(s): Secondary | ICD-10-CM | POA: Diagnosis not present

## 2023-09-24 DIAGNOSIS — M79674 Pain in right toe(s): Secondary | ICD-10-CM | POA: Diagnosis not present

## 2023-09-24 DIAGNOSIS — M79645 Pain in left finger(s): Secondary | ICD-10-CM | POA: Diagnosis not present

## 2023-09-24 DIAGNOSIS — M25562 Pain in left knee: Secondary | ICD-10-CM | POA: Diagnosis not present

## 2023-09-30 DIAGNOSIS — M25562 Pain in left knee: Secondary | ICD-10-CM | POA: Diagnosis not present

## 2023-09-30 DIAGNOSIS — M79674 Pain in right toe(s): Secondary | ICD-10-CM | POA: Diagnosis not present

## 2023-09-30 DIAGNOSIS — M79644 Pain in right finger(s): Secondary | ICD-10-CM | POA: Diagnosis not present

## 2023-09-30 DIAGNOSIS — M79645 Pain in left finger(s): Secondary | ICD-10-CM | POA: Diagnosis not present

## 2023-10-08 DIAGNOSIS — M79644 Pain in right finger(s): Secondary | ICD-10-CM | POA: Diagnosis not present

## 2023-10-08 DIAGNOSIS — M79674 Pain in right toe(s): Secondary | ICD-10-CM | POA: Diagnosis not present

## 2023-10-08 DIAGNOSIS — M79645 Pain in left finger(s): Secondary | ICD-10-CM | POA: Diagnosis not present

## 2023-10-08 DIAGNOSIS — M25562 Pain in left knee: Secondary | ICD-10-CM | POA: Diagnosis not present

## 2023-10-11 DIAGNOSIS — M79645 Pain in left finger(s): Secondary | ICD-10-CM | POA: Diagnosis not present

## 2023-10-11 DIAGNOSIS — M79644 Pain in right finger(s): Secondary | ICD-10-CM | POA: Diagnosis not present

## 2023-10-11 DIAGNOSIS — M25562 Pain in left knee: Secondary | ICD-10-CM | POA: Diagnosis not present

## 2023-10-11 DIAGNOSIS — M79674 Pain in right toe(s): Secondary | ICD-10-CM | POA: Diagnosis not present

## 2023-10-13 DIAGNOSIS — M79644 Pain in right finger(s): Secondary | ICD-10-CM | POA: Diagnosis not present

## 2023-10-13 DIAGNOSIS — M79674 Pain in right toe(s): Secondary | ICD-10-CM | POA: Diagnosis not present

## 2023-10-13 DIAGNOSIS — M79645 Pain in left finger(s): Secondary | ICD-10-CM | POA: Diagnosis not present

## 2023-10-13 DIAGNOSIS — M25562 Pain in left knee: Secondary | ICD-10-CM | POA: Diagnosis not present

## 2023-10-21 ENCOUNTER — Other Ambulatory Visit: Payer: Self-pay | Admitting: *Deleted

## 2023-10-21 DIAGNOSIS — E039 Hypothyroidism, unspecified: Secondary | ICD-10-CM

## 2023-10-21 MED ORDER — SYNTHROID 125 MCG PO TABS: 125.0000 ug | ORAL_TABLET | Freq: Every day | ORAL | 1 refills | Status: DC

## 2023-10-22 DIAGNOSIS — M25562 Pain in left knee: Secondary | ICD-10-CM | POA: Diagnosis not present

## 2023-10-22 DIAGNOSIS — M79674 Pain in right toe(s): Secondary | ICD-10-CM | POA: Diagnosis not present

## 2023-10-22 DIAGNOSIS — M79644 Pain in right finger(s): Secondary | ICD-10-CM | POA: Diagnosis not present

## 2023-10-22 DIAGNOSIS — M79645 Pain in left finger(s): Secondary | ICD-10-CM | POA: Diagnosis not present

## 2023-10-26 DIAGNOSIS — M79674 Pain in right toe(s): Secondary | ICD-10-CM | POA: Diagnosis not present

## 2023-10-26 DIAGNOSIS — M25562 Pain in left knee: Secondary | ICD-10-CM | POA: Diagnosis not present

## 2023-10-26 DIAGNOSIS — M79644 Pain in right finger(s): Secondary | ICD-10-CM | POA: Diagnosis not present

## 2023-10-26 DIAGNOSIS — M79645 Pain in left finger(s): Secondary | ICD-10-CM | POA: Diagnosis not present

## 2023-10-29 DIAGNOSIS — M79674 Pain in right toe(s): Secondary | ICD-10-CM | POA: Diagnosis not present

## 2023-10-29 DIAGNOSIS — M79645 Pain in left finger(s): Secondary | ICD-10-CM | POA: Diagnosis not present

## 2023-10-29 DIAGNOSIS — M79644 Pain in right finger(s): Secondary | ICD-10-CM | POA: Diagnosis not present

## 2023-10-29 DIAGNOSIS — M25562 Pain in left knee: Secondary | ICD-10-CM | POA: Diagnosis not present

## 2023-12-01 DIAGNOSIS — F4322 Adjustment disorder with anxiety: Secondary | ICD-10-CM | POA: Diagnosis not present

## 2023-12-01 DIAGNOSIS — F411 Generalized anxiety disorder: Secondary | ICD-10-CM | POA: Diagnosis not present

## 2023-12-01 DIAGNOSIS — F331 Major depressive disorder, recurrent, moderate: Secondary | ICD-10-CM | POA: Diagnosis not present

## 2023-12-01 DIAGNOSIS — F419 Anxiety disorder, unspecified: Secondary | ICD-10-CM | POA: Diagnosis not present

## 2023-12-06 DIAGNOSIS — M79644 Pain in right finger(s): Secondary | ICD-10-CM | POA: Diagnosis not present

## 2023-12-06 DIAGNOSIS — M79645 Pain in left finger(s): Secondary | ICD-10-CM | POA: Diagnosis not present

## 2023-12-06 DIAGNOSIS — M79674 Pain in right toe(s): Secondary | ICD-10-CM | POA: Diagnosis not present

## 2023-12-06 DIAGNOSIS — M25562 Pain in left knee: Secondary | ICD-10-CM | POA: Diagnosis not present

## 2023-12-13 DIAGNOSIS — M79674 Pain in right toe(s): Secondary | ICD-10-CM | POA: Diagnosis not present

## 2023-12-13 DIAGNOSIS — M79644 Pain in right finger(s): Secondary | ICD-10-CM | POA: Diagnosis not present

## 2023-12-13 DIAGNOSIS — M25562 Pain in left knee: Secondary | ICD-10-CM | POA: Diagnosis not present

## 2023-12-13 DIAGNOSIS — M79645 Pain in left finger(s): Secondary | ICD-10-CM | POA: Diagnosis not present

## 2023-12-17 DIAGNOSIS — F4320 Adjustment disorder, unspecified: Secondary | ICD-10-CM | POA: Diagnosis not present

## 2023-12-31 DIAGNOSIS — F4320 Adjustment disorder, unspecified: Secondary | ICD-10-CM | POA: Diagnosis not present

## 2024-01-06 DIAGNOSIS — F4320 Adjustment disorder, unspecified: Secondary | ICD-10-CM | POA: Diagnosis not present

## 2024-01-11 DIAGNOSIS — F4320 Adjustment disorder, unspecified: Secondary | ICD-10-CM | POA: Diagnosis not present

## 2024-01-21 DIAGNOSIS — F4320 Adjustment disorder, unspecified: Secondary | ICD-10-CM | POA: Diagnosis not present

## 2024-01-28 DIAGNOSIS — F4320 Adjustment disorder, unspecified: Secondary | ICD-10-CM | POA: Diagnosis not present

## 2024-02-03 DIAGNOSIS — F419 Anxiety disorder, unspecified: Secondary | ICD-10-CM | POA: Diagnosis not present

## 2024-02-03 DIAGNOSIS — F4320 Adjustment disorder, unspecified: Secondary | ICD-10-CM | POA: Diagnosis not present

## 2024-02-03 DIAGNOSIS — F4323 Adjustment disorder with mixed anxiety and depressed mood: Secondary | ICD-10-CM | POA: Diagnosis not present

## 2024-02-03 DIAGNOSIS — F411 Generalized anxiety disorder: Secondary | ICD-10-CM | POA: Diagnosis not present

## 2024-02-03 DIAGNOSIS — F331 Major depressive disorder, recurrent, moderate: Secondary | ICD-10-CM | POA: Diagnosis not present

## 2024-02-07 ENCOUNTER — Ambulatory Visit (INDEPENDENT_AMBULATORY_CARE_PROVIDER_SITE_OTHER): Admitting: Family

## 2024-02-07 ENCOUNTER — Encounter: Payer: Self-pay | Admitting: Family

## 2024-02-07 VITALS — BP 124/76 | HR 66 | Temp 98.0°F | Ht 66.0 in | Wt 187.0 lb

## 2024-02-07 DIAGNOSIS — Z0000 Encounter for general adult medical examination without abnormal findings: Secondary | ICD-10-CM | POA: Diagnosis not present

## 2024-02-07 DIAGNOSIS — R7989 Other specified abnormal findings of blood chemistry: Secondary | ICD-10-CM

## 2024-02-07 DIAGNOSIS — Z13220 Encounter for screening for lipoid disorders: Secondary | ICD-10-CM

## 2024-02-07 DIAGNOSIS — E063 Autoimmune thyroiditis: Secondary | ICD-10-CM | POA: Diagnosis not present

## 2024-02-07 DIAGNOSIS — Z23 Encounter for immunization: Secondary | ICD-10-CM | POA: Diagnosis not present

## 2024-02-07 DIAGNOSIS — Z91018 Allergy to other foods: Secondary | ICD-10-CM

## 2024-02-07 DIAGNOSIS — Z975 Presence of (intrauterine) contraceptive device: Secondary | ICD-10-CM | POA: Insufficient documentation

## 2024-02-07 DIAGNOSIS — R600 Localized edema: Secondary | ICD-10-CM

## 2024-02-07 DIAGNOSIS — Z1231 Encounter for screening mammogram for malignant neoplasm of breast: Secondary | ICD-10-CM

## 2024-02-07 DIAGNOSIS — Z0001 Encounter for general adult medical examination with abnormal findings: Secondary | ICD-10-CM

## 2024-02-07 DIAGNOSIS — D500 Iron deficiency anemia secondary to blood loss (chronic): Secondary | ICD-10-CM | POA: Diagnosis not present

## 2024-02-07 LAB — LIPID PANEL
Cholesterol: 138 mg/dL (ref 0–200)
HDL: 64.1 mg/dL (ref >39.00)
LDL Cholesterol: 59 mg/dL (ref 0–99)
NonHDL: 74.2
Total CHOL/HDL Ratio: 2
Triglycerides: 75 mg/dL (ref 0.0–149.0)
VLDL: 15 mg/dL (ref 0.0–40.0)

## 2024-02-07 LAB — CBC
HCT: 39.5 % (ref 36.0–46.0)
Hemoglobin: 13 g/dL (ref 12.0–15.0)
MCHC: 32.9 g/dL (ref 30.0–36.0)
MCV: 88.7 fl (ref 78.0–100.0)
Platelets: 230 K/uL (ref 150.0–400.0)
RBC: 4.46 Mil/uL (ref 3.87–5.11)
RDW: 13.3 % (ref 11.5–15.5)
WBC: 4.7 K/uL (ref 4.0–10.5)

## 2024-02-07 LAB — COMPREHENSIVE METABOLIC PANEL WITH GFR
ALT: 51 U/L — ABNORMAL HIGH (ref 0–35)
AST: 33 U/L (ref 0–37)
Albumin: 4.1 g/dL (ref 3.5–5.2)
Alkaline Phosphatase: 65 U/L (ref 39–117)
BUN: 16 mg/dL (ref 6–23)
CO2: 29 meq/L (ref 19–32)
Calcium: 8.9 mg/dL (ref 8.4–10.5)
Chloride: 102 meq/L (ref 96–112)
Creatinine, Ser: 0.99 mg/dL (ref 0.40–1.20)
GFR: 65.24 mL/min (ref >60.00)
Glucose, Bld: 87 mg/dL (ref 70–99)
Potassium: 4 meq/L (ref 3.5–5.1)
Sodium: 139 meq/L (ref 135–145)
Total Bilirubin: 0.5 mg/dL (ref 0.2–1.2)
Total Protein: 7.4 g/dL (ref 6.0–8.3)

## 2024-02-07 LAB — TSH: TSH: 0.52 u[IU]/mL (ref 0.35–5.50)

## 2024-02-07 MED ORDER — HYDROCHLOROTHIAZIDE 12.5 MG PO CAPS: ORAL_CAPSULE | ORAL | 1 refills | Status: AC

## 2024-02-07 NOTE — Progress Notes (Signed)
 Subjective:  Patient ID: Laura Spencer, female    DOB: 11/17/1970  Age: 53 y.o. MRN: [id number]  Patient Care Team: Corwin Antu, FNP as PCP - General (Family Medicine)   CC:  Chief Complaint  Patient presents with   Annual Exam    HPI Laura Spencer is a 53 y.o. female who presents today for an annual physical exam. She reports consuming a general diet. Dance cardio joined a gym, goal is from 2x a week to 4 x a week She generally feels well. She reports sleeping well. She does not have additional problems to discuss today.   Vision:Within last year dental up to date  Mammogram: 2023 Last pap: June 22 23, every five years Colonoscopy: aug 7th 2023  Pt is without acute concerns.   Wt Readings from Last 3 Encounters:  02/07/24 187 lb (84.8 kg)  08/27/23 181 lb (82.1 kg)  07/30/23 179 lb 12.8 oz (81.6 kg)       Advanced Directives Patient does have advanced directives. She does not have a copy in the electronic medical record.   DEPRESSION SCREENING    02/07/2024   11:26 AM 02/19/2023   10:06 AM 10/08/2022    9:48 AM 08/25/2022    2:41 PM 07/13/2022    2:46 PM  PHQ 2/9 Scores  PHQ - 2 Score 0 1 0 2 0  PHQ- 9 Score 2 3   3      ROS: Negative unless specifically indicated above in HPI.    Current Outpatient Medications:    buPROPion (WELLBUTRIN XL) 150 MG 24 hr tablet, Take 1 tablet by mouth daily., Disp: , Rfl:    estradiol (VIVELLE-DOT) 0.05 MG/24HR patch, Place onto the skin., Disp: , Rfl:    ferrous sulfate 325 (65 FE) MG EC tablet, Take 325 mg by mouth daily., Disp: , Rfl:    Fexofenadine HCl (ALLEGRA PO), Take by mouth daily., Disp: , Rfl:    fluticasone (FLONASE) 50 MCG/ACT nasal spray, Place into both nostrils daily., Disp: , Rfl:    methylphenidate (RITALIN) 10 MG tablet, Take 10 mg by mouth daily., Disp: , Rfl:    methylphenidate 27 MG PO CR tablet, Take 27 mg by mouth daily., Disp: , Rfl:    Multiple Vitamin (MULTI-VITAMIN) tablet, Take 1 tablet by  mouth daily., Disp: , Rfl:    progesterone (PROMETRIUM) 100 MG capsule, Take by mouth., Disp: , Rfl:    SYNTHROID  125 MCG tablet, Take 1 tablet (125 mcg total) by mouth daily before breakfast., Disp: 90 tablet, Rfl: 1   TURMERIC PO, Take by mouth every other day., Disp: , Rfl:     Objective:    BP 124/76 (BP Location: Right Arm, Patient Position: Sitting, Cuff Size: Normal)   Pulse 66   Temp 98 F (36.7 C)   Ht 5' 6 (1.676 m)   Wt 187 lb (84.8 kg)   SpO2 98%   BMI 30.18 kg/m   BP Readings from Last 3 Encounters:  02/07/24 124/76  08/27/23 123/80  07/30/23 116/79      Physical Exam Vitals reviewed.  Constitutional:      General: She is not in acute distress.    Appearance: Normal appearance. She is normal weight. She is not ill-appearing.  HENT:     Head: Normocephalic.     Right Ear: Tympanic membrane normal.     Left Ear: Tympanic membrane normal.     Nose: Nose normal.     Mouth/Throat:  Mouth: Mucous membranes are moist.  Eyes:     Extraocular Movements: Extraocular movements intact.     Pupils: Pupils are equal, round, and reactive to light.  Cardiovascular:     Rate and Rhythm: Normal rate and regular rhythm.     Pulses:          Dorsalis pedis pulses are 2+ on the right side and 1+ on the left side.       Posterior tibial pulses are 2+ on the right side and 2+ on the left side.  Pulmonary:     Effort: Pulmonary effort is normal.     Breath sounds: Normal breath sounds.  Musculoskeletal:        General: Normal range of motion.     Cervical back: Normal range of motion.     Left lower leg: 1+ Pitting Edema present.  Skin:    General: Skin is warm.     Capillary Refill: Capillary refill takes less than 2 seconds.  Neurological:     General: No focal deficit present.     Mental Status: She is alert.  Psychiatric:        Mood and Affect: Mood normal.        Behavior: Behavior normal.        Thought Content: Thought content normal.        Judgment:  Judgment normal.       Results LABS Food Allergy Panel: Positive for multiple food allergies, including corn and egg (02/19/2024)      Assessment & Plan:   Assessment and Plan Assessment & Plan Adult Wellness Visit Routine wellness visit with no acute concerns. Discussion about genetic testing and its potential benefits and limitations. - Administer pneumonia vaccine - Administer shingles vaccine  Autoimmune hypothyroidism (Hashimoto's thyroiditis) Thyroid  labs satisfactory. She is on Synthroid  125 mcg oral Monday - Saturday.  Arthritis with polyarthralgia Chronic arthritis with polyarthralgia. Physical therapy has been beneficial in reducing pain. - Encourage regular exercise regimen  Localized edema, left ankle/foot Chronic swelling in the left ankle/foot with no history of injury. Compression stockings provide minimal relief. Differential includes circulatory issues, arthritis-related swelling, or idiopathic causes. No concerning findings on physical exam. Consideration for vascular study if needed. - Prescribe hydrochlorothiazide  as needed for swelling, with reassurance that it is a low dose to avoid affecting blood pressure - Encourage use of compression stockings - Advise elevation of the leg when possible - Advise reducing salt intake - Encourage regular movement and walking every hour  Multiple food allergies and sensitivities Multiple food allergies identified. She has eliminated corn and egg from diet. - Reissue referral to allergist for further evaluation  Attention deficit hyperactivity disorder (ADHD) ADHD symptoms are well-managed with current medication regimen. She reports improvement in focus and reduction in symptoms.  Menopausal symptoms Persistent low sex drive despite Wellbutrin. Previous trial of black cohosh was not well-tolerated. - Discuss potential next steps including pelvic floor therapy or sex therapy  Recording duration: 14  minutes        Follow-up: No follow-ups on file.   Laura Patrick, FNP

## 2024-02-09 ENCOUNTER — Ambulatory Visit: Payer: Self-pay | Admitting: Family

## 2024-02-09 DIAGNOSIS — E039 Hypothyroidism, unspecified: Secondary | ICD-10-CM

## 2024-02-09 DIAGNOSIS — E063 Autoimmune thyroiditis: Secondary | ICD-10-CM

## 2024-02-09 MED ORDER — SYNTHROID 125 MCG PO TABS: ORAL_TABLET | ORAL | Status: AC

## 2024-02-11 DIAGNOSIS — F4320 Adjustment disorder, unspecified: Secondary | ICD-10-CM | POA: Diagnosis not present

## 2024-02-18 DIAGNOSIS — F4320 Adjustment disorder, unspecified: Secondary | ICD-10-CM | POA: Diagnosis not present

## 2024-02-24 DIAGNOSIS — F4320 Adjustment disorder, unspecified: Secondary | ICD-10-CM | POA: Diagnosis not present

## 2024-02-25 ENCOUNTER — Other Ambulatory Visit: Payer: Self-pay | Admitting: *Deleted

## 2024-02-25 DIAGNOSIS — R768 Other specified abnormal immunological findings in serum: Secondary | ICD-10-CM | POA: Diagnosis not present

## 2024-02-27 LAB — CBC WITH DIFFERENTIAL/PLATELET
Absolute Lymphocytes: 1505 {cells}/uL (ref 850–3900)
Absolute Monocytes: 410 {cells}/uL (ref 200–950)
Basophils Absolute: 60 {cells}/uL (ref 0–200)
Basophils Relative: 1.2 %
Eosinophils Absolute: 250 {cells}/uL (ref 15–500)
Eosinophils Relative: 5 %
HCT: 42.3 % (ref 35.0–45.0)
Hemoglobin: 14 g/dL (ref 11.7–15.5)
MCH: 29.7 pg (ref 27.0–33.0)
MCHC: 33.1 g/dL (ref 32.0–36.0)
MCV: 89.6 fL (ref 80.0–100.0)
MPV: 11.2 fL (ref 7.5–12.5)
Monocytes Relative: 8.2 %
Neutro Abs: 2775 {cells}/uL (ref 1500–7800)
Neutrophils Relative %: 55.5 %
Platelets: 247 Thousand/uL (ref 140–400)
RBC: 4.72 Million/uL (ref 3.80–5.10)
RDW: 12.8 % (ref 11.0–15.0)
Total Lymphocyte: 30.1 %
WBC: 5 Thousand/uL (ref 3.8–10.8)

## 2024-02-27 LAB — COMPLETE METABOLIC PANEL WITHOUT GFR
AG Ratio: 1.4 (calc) (ref 1.0–2.5)
ALT: 39 U/L — ABNORMAL HIGH (ref 6–29)
AST: 21 U/L (ref 10–35)
Albumin: 4.2 g/dL (ref 3.6–5.1)
Alkaline phosphatase (APISO): 70 U/L (ref 37–153)
BUN: 17 mg/dL (ref 7–25)
CO2: 30 mmol/L (ref 20–32)
Calcium: 9.6 mg/dL (ref 8.6–10.4)
Chloride: 103 mmol/L (ref 98–110)
Creat: 0.97 mg/dL (ref 0.50–1.03)
Globulin: 2.9 g/dL (ref 1.9–3.7)
Glucose, Bld: 67 mg/dL (ref 65–99)
Potassium: 4.4 mmol/L (ref 3.5–5.3)
Sodium: 139 mmol/L (ref 135–146)
Total Bilirubin: 0.6 mg/dL (ref 0.2–1.2)
Total Protein: 7.1 g/dL (ref 6.1–8.1)

## 2024-02-27 LAB — PROTEIN / CREATININE RATIO, URINE
Creatinine, Urine: 20 mg/dL (ref 20–275)
Protein/Creat Ratio: 200 mg/g{creat} — ABNORMAL HIGH (ref 24–184)
Protein/Creatinine Ratio: 0.2 mg/mg{creat} — ABNORMAL HIGH (ref 0.024–0.184)
Total Protein, Urine: 4 mg/dL — ABNORMAL LOW (ref 5–24)

## 2024-02-27 LAB — ANA: Anti Nuclear Antibody (ANA): POSITIVE — AB

## 2024-02-27 LAB — SEDIMENTATION RATE: Sed Rate: 11 mm/h (ref 0–30)

## 2024-02-27 LAB — C3 AND C4
C3 Complement: 136 mg/dL (ref 83–193)
C4 Complement: 31 mg/dL (ref 15–57)

## 2024-02-27 LAB — ANTI-NUCLEAR AB-TITER (ANA TITER)
ANA TITER: 1:40 {titer} — ABNORMAL HIGH
ANA Titer 1: 1:40 {titer} — ABNORMAL HIGH

## 2024-02-27 LAB — ANTI-DNA ANTIBODY, DOUBLE-STRANDED: ds DNA Ab: 11 [IU]/mL — ABNORMAL HIGH

## 2024-02-28 ENCOUNTER — Ambulatory Visit: Payer: Self-pay | Admitting: Rheumatology

## 2024-02-28 NOTE — Progress Notes (Signed)
 CBC normal, ALT is still elevated but improved, ANA is low titer positive, double-stranded dNA remains positive, urine protein creatinine ratio was positive, sed rate normal, complements normal.  Will recheck urine protein creatinine ratio with the next labs.  Patient should avoid all NSAIDs.  I will discuss results and treatment plan.

## 2024-02-28 NOTE — Progress Notes (Signed)
 Office Visit Note  Patient: Laura Spencer             Date of Birth: 1970/11/30           MRN: [id number]             PCP: Corwin Antu, FNP Referring: Corwin Antu, FNP Visit Date: 03/13/2024 Occupation: Data Unavailable  Subjective:  Fatigue  History of Present Illness: Laura Spencer is a 53 y.o. female with positive ANA and polyarthralgia.  She returns today after her last visit in March 2025.  She states she has been going to physical therapy and has noticed improvement in her joint symptoms.  They have been working with her on osteoarthritis of her hands, right trochanteric bursae and knee joint discomfort.  She has not noticed any joint swelling.  She continues to have some morning stiffness.  She gives history of fatigue, dry mouth, arthralgias.  There is no history of oral ulcers, nasal ulcers, malar rash, photosensitivity, Raynaud's, lymphadenopathy.    Activities of Daily Living:  Patient reports morning stiffness for less than 30 minutes.   Patient Denies nocturnal pain.  Difficulty dressing/grooming: Denies Difficulty climbing stairs: Reports Difficulty getting out of chair: Reports Difficulty using hands for taps, buttons, cutlery, and/or writing: Reports  Review of Systems  Constitutional:  Positive for appetite change and fatigue.  HENT:  Positive for mouth dryness. Negative for mouth sores.   Eyes:  Negative for dryness.  Respiratory:  Negative for shortness of breath.   Cardiovascular:  Positive for swelling in legs/feet. Negative for chest pain and palpitations.  Gastrointestinal:  Negative for blood in stool, constipation and diarrhea.  Endocrine: Negative for increased urination.  Genitourinary:  Negative for involuntary urination.  Musculoskeletal:  Positive for joint pain, joint pain, myalgias, morning stiffness and myalgias. Negative for gait problem, joint swelling, muscle weakness and muscle tenderness.  Skin:  Negative for color change, rash,  hair loss and sensitivity to sunlight.  Allergic/Immunologic: Negative for susceptible to infections.  Neurological:  Positive for dizziness. Negative for headaches.  Hematological:  Negative for swollen glands.  Psychiatric/Behavioral:  Positive for depressed mood. Negative for sleep disturbance. The patient is nervous/anxious.     PMFS History:  Patient Active Problem List   Diagnosis Date Noted   IUD (intrauterine device) in place 02/07/2024   Menopause 02/19/2023   Bilateral bunions 10/08/2022   Plantar fasciitis 10/08/2022   Elevated LFTs 10/08/2022   Non-seasonal allergic rhinitis 07/13/2022   Iron deficiency anemia 07/13/2022   Keratosis pilaris 07/13/2022   Vertigo 07/13/2022   Plantar wart 09/06/2015   Migraine 07/19/2015   Hypothyroidism 06/14/2012    Past Medical History:  Diagnosis Date   Anemia    Anxiety    Attention deficit hyperactivity disorder    Complication of anesthesia    Depression    Hashimoto's disease    Headache    hx of migraines   Hypothyroidism    PONV (postoperative nausea and vomiting)    Vertigo 02/2022    Family History  Problem Relation Age of Onset   Mental illness Mother    Depression Mother    Anxiety disorder Mother    Other Mother        hx of ankle surgery   Thyroid  disease Mother    Parkinson's disease Mother    Angina Father    Heart disease Father        bypass h/o   Healthy Son    Epilepsy Son  ADD / ADHD Son    Thyroid  disease Half-Sister    Obesity Half-Sister    Bipolar disorder Half-Sister    Breast cancer Neg Hx    Ovarian cancer Neg Hx    Colon cancer Neg Hx    Endometrial cancer Neg Hx    Past Surgical History:  Procedure Laterality Date   CESAREAN SECTION     x2   COLONOSCOPY     ROBOTIC ASSISTED SALPINGO OOPHERECTOMY N/A 06/23/2022   Procedure: XI ROBOTIC ASSISTED RIGHT SALPINGO OOPHORECTOMY, LEFT SALPINGOECTOMY;  Surgeon: Eldonna Mays, MD;  Location: [facility];  Service: Gynecology;  Laterality:  N/A;   Social History   Tobacco Use   Smoking status: Never    Passive exposure: Never   Smokeless tobacco: Never  Vaping Use   Vaping status: Never Used  Substance Use Topics   Alcohol use: Yes    Alcohol/week: 1.0 standard drink of alcohol    Types: 1 Standard drinks or equivalent per week    Comment: rarely   Drug use: Not Currently    Comment: Gummy  with THC in it   Social History   Social History Narrative   Not on file     Immunization History  Administered Date(s) Administered   Influenza, Seasonal, Injecte, Preservative Fre 02/19/2023   Influenza,inj,Quad PF,6+ Mos 07/26/2015   Influenza-Unspecified 06/14/2012   PFIZER(Purple Top)SARS-COV-2 Vaccination 08/16/2019, 09/13/2019, 05/20/2020, 11/29/2020, 06/20/2021   PNEUMOCOCCAL CONJUGATE-20 02/07/2024   Tdap 07/26/2015     Objective: Vital Signs: BP 121/78   Pulse 69   Temp 97.6 F (36.4 C)   Resp 16   Ht 5' 6 (1.676 m)   Wt 188 lb 12.8 oz (85.6 kg)   BMI 30.47 kg/m    Physical Exam Vitals and nursing note reviewed.  Constitutional:      Appearance: She is well-developed.  HENT:     Head: Normocephalic and atraumatic.  Eyes:     Conjunctiva/sclera: Conjunctivae normal.  Cardiovascular:     Rate and Rhythm: Normal rate and regular rhythm.     Heart sounds: Normal heart sounds.  Pulmonary:     Effort: Pulmonary effort is normal.     Breath sounds: Normal breath sounds.  Abdominal:     General: Bowel sounds are normal.     Palpations: Abdomen is soft.  Musculoskeletal:     Cervical back: Normal range of motion.  Lymphadenopathy:     Cervical: No cervical adenopathy.  Skin:    General: Skin is warm and dry.     Capillary Refill: Capillary refill takes less than 2 seconds.     Comments: Generalized facial erythema was noted.  No nasolabial fold sparing was noted.  No nailbed capillary changes were noted.  Neurological:     Mental Status: She is alert and oriented to person, place, and time.   Psychiatric:        Behavior: Behavior normal.      Musculoskeletal Exam: Cervical, thoracic and lumbar spine were in good range of motion.  She has some discomfort with range of motion of her lumbar spine.  There was no SI joint tenderness.  Shoulder joints, elbow joints, wrist joints, MCPs, PIPs and DIPs were in good range of motion with no synovitis.  Bilateral PIP and DIP thickening with subluxation of bilateral third finger DIP joints was noted.  Hip joints and knee joints were in good range of motion without any warmth swelling or effusion.  There was no tenderness over ankles or  MTPs.   CDAI Exam: CDAI Score: -- Patient Global: --; Provider Global: -- Swollen: --; Tender: -- Joint Exam 03/13/2024   No joint exam has been documented for this visit   There is currently no information documented on the homunculus. Go to the Rheumatology activity and complete the homunculus joint exam.  Investigation: No additional findings.  Imaging: No results found.  Recent Labs: Lab Results  Component Value Date   WBC 5.0 02/25/2024   HGB 14.0 02/25/2024   PLT 247 02/25/2024   NA 139 02/25/2024   K 4.4 02/25/2024   CL 103 02/25/2024   CO2 30 02/25/2024   GLUCOSE 67 02/25/2024   BUN 17 02/25/2024   CREATININE 0.97 02/25/2024   BILITOT 0.6 02/25/2024   ALKPHOS 65 02/07/2024   AST 21 02/25/2024   ALT 39 (H) 02/25/2024   PROT 7.1 02/25/2024   ALBUMIN 4.1 02/07/2024   CALCIUM 9.6 02/25/2024    Speciality Comments: No specialty comments available.  Procedures:  No procedures performed Allergies: Patient has no known allergies.   Assessment / Plan:     Visit Diagnoses: Polyarthralgia-she complains of pain and discomfort in multiple joints.  No synovitis was noted.  She denies any history of joint swelling.  Positive ANA (antinuclear antibody) - she has low titer positive ANA and double-stranded DNA.  Labs on February 25, 2024 urine protein creatinine ratio 200, ANA low titer  positive at 1: 40, dsDNA 11, C3-C4 normal, sed rate normal.-I advised her to repeat urine protein creatinine ratio in 1 month.  She complains of fatigue and arthralgia.  She has no other clinical features of lupus.  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  Patient would like to hold off medication.  Will recheck labs in 6 months.  I advised her to contact me if she develops any new symptoms.  Plan: Protein / creatinine ratio, urine, CBC with Differential/Platelet, Comprehensive metabolic panel with GFR, ANA, Anti-DNA antibody, double-stranded, C3 and C4, Sedimentation rate  Chronic pain of both shoulders-improved  Primary osteoarthritis of both hands - She has severe osteoarthritis in her hands.  She has bilateral middle finger DIP subluxation.  Joint protection and muscle strengthening was discussed.  Patient has been going to physical therapy.  Trochanteric bursitis, left hip-she has some benefit from physical therapy.  Chronic pain of left knee -doing better.  No warmth swelling or effusion was noted.  X-rays obtained were unremarkable.  Primary osteoarthritis of both feet-proper fitting shoes were advised.  Plantar fasciitis-currently not symptomatic.  Chronic midline low back pain without sciatica -she is been experiencing some discomfort in her lower back.  She has increased lumbar lordosis.  Core strengthening exercises were demonstrated in the office and a handout on exercise was given.  Elevated LFTs-after avoiding NSAIDs her LFTs have improved.  Weight loss diet and exercise was emphasized.  BMI 30.47-I offered referral to weight management clinic.  Patient lives in [place].  She will contact her PCP for a referral locally.  Other medical problems are listed as follows:  History of iron deficiency anemia  Hx of  migraines  Hypothyroidism due to Hashimoto thyroiditis  Anxiety and depression -she has been experiencing more mood swings.  Patient states she has a vacation coming up which will help her to relax.  Attention deficit hyperactivity disorder (ADHD), combined type  Keratosis pilaris  Vertigo  Orders:  Orders Placed This Encounter  Procedures   Protein / creatinine ratio, urine   CBC with Differential/Platelet   Comprehensive metabolic panel with GFR   ANA   Anti-DNA antibody, double-stranded   C3 and C4   Sedimentation rate   No orders of the defined types were placed in this encounter.    Follow-Up Instructions: Return for +ANA.   Maya Nash, MD  Note - This record has been created using Animal nutritionist.  Chart creation errors have been sought, but may not always  have been located. Such creation errors do not reflect on  the standard of medical care.

## 2024-03-03 DIAGNOSIS — F4320 Adjustment disorder, unspecified: Secondary | ICD-10-CM | POA: Diagnosis not present

## 2024-03-09 DIAGNOSIS — F4320 Adjustment disorder, unspecified: Secondary | ICD-10-CM | POA: Diagnosis not present

## 2024-03-13 ENCOUNTER — Ambulatory Visit: Attending: Rheumatology | Admitting: Rheumatology

## 2024-03-13 ENCOUNTER — Encounter: Payer: Self-pay | Admitting: Rheumatology

## 2024-03-13 VITALS — BP 121/78 | HR 69 | Temp 97.6°F | Resp 16 | Ht 66.0 in | Wt 188.8 lb

## 2024-03-13 DIAGNOSIS — M2550 Pain in unspecified joint: Secondary | ICD-10-CM

## 2024-03-13 DIAGNOSIS — M25511 Pain in right shoulder: Secondary | ICD-10-CM | POA: Diagnosis not present

## 2024-03-13 DIAGNOSIS — Z862 Personal history of diseases of the blood and blood-forming organs and certain disorders involving the immune mechanism: Secondary | ICD-10-CM

## 2024-03-13 DIAGNOSIS — R7989 Other specified abnormal findings of blood chemistry: Secondary | ICD-10-CM

## 2024-03-13 DIAGNOSIS — M19072 Primary osteoarthritis, left ankle and foot: Secondary | ICD-10-CM

## 2024-03-13 DIAGNOSIS — R42 Dizziness and giddiness: Secondary | ICD-10-CM

## 2024-03-13 DIAGNOSIS — M19041 Primary osteoarthritis, right hand: Secondary | ICD-10-CM | POA: Diagnosis not present

## 2024-03-13 DIAGNOSIS — M25512 Pain in left shoulder: Secondary | ICD-10-CM

## 2024-03-13 DIAGNOSIS — F32A Depression, unspecified: Secondary | ICD-10-CM

## 2024-03-13 DIAGNOSIS — M25562 Pain in left knee: Secondary | ICD-10-CM

## 2024-03-13 DIAGNOSIS — Z8669 Personal history of other diseases of the nervous system and sense organs: Secondary | ICD-10-CM

## 2024-03-13 DIAGNOSIS — E063 Autoimmune thyroiditis: Secondary | ICD-10-CM

## 2024-03-13 DIAGNOSIS — M7062 Trochanteric bursitis, left hip: Secondary | ICD-10-CM

## 2024-03-13 DIAGNOSIS — F902 Attention-deficit hyperactivity disorder, combined type: Secondary | ICD-10-CM

## 2024-03-13 DIAGNOSIS — R768 Other specified abnormal immunological findings in serum: Secondary | ICD-10-CM | POA: Diagnosis not present

## 2024-03-13 DIAGNOSIS — M19071 Primary osteoarthritis, right ankle and foot: Secondary | ICD-10-CM

## 2024-03-13 DIAGNOSIS — F419 Anxiety disorder, unspecified: Secondary | ICD-10-CM

## 2024-03-13 DIAGNOSIS — Z6830 Body mass index (BMI) 30.0-30.9, adult: Secondary | ICD-10-CM

## 2024-03-13 DIAGNOSIS — M19042 Primary osteoarthritis, left hand: Secondary | ICD-10-CM

## 2024-03-13 DIAGNOSIS — M722 Plantar fascial fibromatosis: Secondary | ICD-10-CM

## 2024-03-13 DIAGNOSIS — G8929 Other chronic pain: Secondary | ICD-10-CM

## 2024-03-13 DIAGNOSIS — M545 Low back pain, unspecified: Secondary | ICD-10-CM

## 2024-03-13 DIAGNOSIS — L858 Other specified epidermal thickening: Secondary | ICD-10-CM

## 2024-03-13 NOTE — Patient Instructions (Signed)
 Low Back Sprain or Strain Rehab Ask your health care provider which exercises are safe for you. Do exercises exactly as told by your health care provider and adjust them as directed. It is normal to feel mild stretching, pulling, tightness, or discomfort as you do these exercises. Stop right away if you feel sudden pain or your pain gets worse. Do not begin these exercises until told by your health care provider. Stretching and range-of-motion exercises These exercises warm up your muscles and joints and improve the movement and flexibility of your back. These exercises also help to relieve pain, numbness, and tingling. Lumbar rotation  Lie on your back on a firm bed or the floor with your knees bent. Straighten your arms out to your sides so each arm forms a 90-degree angle (right angle) with a side of your body. Slowly move (rotate) both of your knees to one side of your body until you feel a stretch in your lower back (lumbar). Try not to let your shoulders lift off the floor. Hold this position for __________ seconds. Tense your abdominal muscles and slowly move your knees back to the starting position. Repeat this exercise on the other side of your body. Repeat __________ times. Complete this exercise __________ times a day. Single knee to chest  Lie on your back on a firm bed or the floor with both legs straight. Bend one of your knees. Use your hands to move your knee up toward your chest until you feel a gentle stretch in your lower back and buttock. Hold your leg in this position by holding on to the front of your knee. Keep your other leg as straight as possible. Hold this position for __________ seconds. Slowly return to the starting position. Repeat with your other leg. Repeat __________ times. Complete this exercise __________ times a day. Prone extension on elbows  Lie on your abdomen on a firm bed or the floor (prone position). Prop yourself up on your elbows. Use your arms  to help lift your chest up until you feel a gentle stretch in your abdomen and your lower back. This will place some of your body weight on your elbows. If this is uncomfortable, try stacking pillows under your chest. Your hips should stay down, against the surface that you are lying on. Keep your hip and back muscles relaxed. Hold this position for __________ seconds. Slowly relax your upper body and return to the starting position. Repeat __________ times. Complete this exercise __________ times a day. Strengthening exercises These exercises build strength and endurance in your back. Endurance is the ability to use your muscles for a long time, even after they get tired. Pelvic tilt This exercise strengthens the muscles that lie deep in the abdomen. Lie on your back on a firm bed or the floor with your legs extended. Bend your knees so they are pointing toward the ceiling and your feet are flat on the floor. Tighten your lower abdominal muscles to press your lower back against the floor. This motion will tilt your pelvis so your tailbone points up toward the ceiling instead of pointing to your feet or the floor. To help with this exercise, you may place a small towel under your lower back and try to push your back into the towel. Hold this position for __________ seconds. Let your muscles relax completely before you repeat this exercise. Repeat __________ times. Complete this exercise __________ times a day. Alternating arm and leg raises  Get on your hands  and knees on a firm surface. If you are on a hard floor, you may want to use padding, such as an exercise mat, to cushion your knees. Line up your arms and legs. Your hands should be directly below your shoulders, and your knees should be directly below your hips. Lift your left leg behind you. At the same time, raise your right arm and straighten it in front of you. Do not lift your leg higher than your hip. Do not lift your arm higher  than your shoulder. Keep your abdominal and back muscles tight. Keep your hips facing the ground. Do not arch your back. Keep your balance carefully, and do not hold your breath. Hold this position for __________ seconds. Slowly return to the starting position. Repeat with your right leg and your left arm. Repeat __________ times. Complete this exercise __________ times a day. Abdominal set with straight leg raise  Lie on your back on a firm bed or the floor. Bend one of your knees and keep your other leg straight. Tense your abdominal muscles and lift your straight leg up, 4-6 inches (10-15 cm) off the ground. Keep your abdominal muscles tight and hold this position for __________ seconds. Do not hold your breath. Do not arch your back. Keep it flat against the ground. Keep your abdominal muscles tense as you slowly lower your leg back to the starting position. Repeat with your other leg. Repeat __________ times. Complete this exercise __________ times a day. Single leg lower with bent knees Lie on your back on a firm bed or the floor. Tense your abdominal muscles and lift your feet off the floor, one foot at a time, so your knees and hips are bent in 90-degree angles (right angles). Your knees should be over your hips and your lower legs should be parallel to the floor. Keeping your abdominal muscles tense and your knee bent, slowly lower one of your legs so your toe touches the ground. Lift your leg back up to return to the starting position. Do not hold your breath. Do not let your back arch. Keep your back flat against the ground. Repeat with your other leg. Repeat __________ times. Complete this exercise __________ times a day. Posture and body mechanics Good posture and healthy body mechanics can help to relieve stress in your body's tissues and joints. Body mechanics refers to the movements and positions of your body while you do your daily activities. Posture is part of body  mechanics. Good posture means: Your spine is in its natural S-curve position (neutral). Your shoulders are pulled back slightly. Your head is not tipped forward (neutral). Follow these guidelines to improve your posture and body mechanics in your everyday activities. Standing  When standing, keep your spine neutral and your feet about hip-width apart. Keep a slight bend in your knees. Your ears, shoulders, and hips should line up. When you do a task in which you stand in one place for a long time, place one foot up on a stable object that is 2-4 inches (5-10 cm) high, such as a footstool. This helps keep your spine neutral. Sitting  When sitting, keep your spine neutral and keep your feet flat on the floor. Use a footrest, if necessary, and keep your thighs parallel to the floor. Avoid rounding your shoulders, and avoid tilting your head forward. When working at a desk or a computer, keep your desk at a height where your hands are slightly lower than your elbows. Slide your  chair under your desk so you are close enough to maintain good posture. When working at a computer, place your monitor at a height where you are looking straight ahead and you do not have to tilt your head forward or downward to look at the screen. Resting When lying down and resting, avoid positions that are most painful for you. If you have pain with activities such as sitting, bending, stooping, or squatting, lie in a position in which your body does not bend very much. For example, avoid curling up on your side with your arms and knees near your chest (fetal position). If you have pain with activities such as standing for a long time or reaching with your arms, lie with your spine in a neutral position and bend your knees slightly. Try the following positions: Lying on your side with a pillow between your knees. Lying on your back with a pillow under your knees. Lifting  When lifting objects, keep your feet at least  shoulder-width apart and tighten your abdominal muscles. Bend your knees and hips and keep your spine neutral. It is important to lift using the strength of your legs, not your back. Do not lock your knees straight out. Always ask for help to lift heavy or awkward objects. This information is not intended to replace advice given to you by your health care provider. Make sure you discuss any questions you have with your health care provider. Document Revised: 10/05/2022 Document Reviewed: 08/19/2020 Elsevier Patient Education  2024 [employer].

## 2024-03-15 ENCOUNTER — Encounter (INDEPENDENT_AMBULATORY_CARE_PROVIDER_SITE_OTHER): Admitting: Vascular Surgery

## 2024-03-15 ENCOUNTER — Encounter: Payer: Self-pay | Admitting: *Deleted

## 2024-03-17 ENCOUNTER — Ambulatory Visit (INDEPENDENT_AMBULATORY_CARE_PROVIDER_SITE_OTHER): Admitting: Vascular Surgery

## 2024-03-17 ENCOUNTER — Encounter (INDEPENDENT_AMBULATORY_CARE_PROVIDER_SITE_OTHER): Payer: Self-pay | Admitting: Vascular Surgery

## 2024-03-17 VITALS — BP 108/70 | HR 78 | Resp 16 | Ht 66.0 in | Wt 188.0 lb

## 2024-03-17 DIAGNOSIS — F4320 Adjustment disorder, unspecified: Secondary | ICD-10-CM | POA: Diagnosis not present

## 2024-03-17 DIAGNOSIS — I890 Lymphedema, not elsewhere classified: Secondary | ICD-10-CM

## 2024-03-17 DIAGNOSIS — E039 Hypothyroidism, unspecified: Secondary | ICD-10-CM

## 2024-03-17 NOTE — Progress Notes (Signed)
 Subjective:    Patient ID: Laura Spencer, female    DOB: [birth year], 53 y.o.   MRN: [id number] Chief Complaint  Patient presents with   New Patient (Initial Visit)    Consult swelling lower left extremity ( Laura Spencer)     Laura Spencer is a 53 yo female who presents to clinic today with chief complaint of left lower extremity edema.  Patient endorses she has had left lower extremity edema mostly to her ankles and calf over the past couple years.  Is been intermittent.  She has had periods of time where has been quite extensive with throbbing pain and other days like today where it is difficult to notice.  She denies having any sores or open wounds.  She denies any cellulitis.  She denies having any pain on a regular basis other than the fact that when her leg get swollen is very uncomfortable.  She denies ever having used any conventional therapies such as compression socks, rest, elevation and exercise.    Review of Systems  Constitutional: Negative.   Cardiovascular:  Positive for leg swelling.  Musculoskeletal:  Positive for myalgias.  All other systems reviewed and are negative.      Objective:   Physical Exam Constitutional:      Appearance: Normal appearance. She is normal weight.  HENT:     Head: Normocephalic.  Eyes:     Pupils: Pupils are equal, round, and reactive to light.  Cardiovascular:     Rate and Rhythm: Normal rate and regular rhythm.     Pulses: Normal pulses.     Heart sounds: Normal heart sounds.  Pulmonary:     Effort: Pulmonary effort is normal.     Breath sounds: Normal breath sounds.  Abdominal:     General: Abdomen is flat. Bowel sounds are normal.     Palpations: Abdomen is soft.  Musculoskeletal:        General: Swelling present.     Left lower leg: Edema present.  Skin:    General: Skin is warm and dry.     Capillary Refill: Capillary refill takes 2 to 3 seconds.  Neurological:     General: No focal deficit present.     Mental  Status: She is alert and oriented to person, place, and time. Mental status is at baseline.  Psychiatric:        Mood and Affect: Mood normal.        Behavior: Behavior normal.        Thought Content: Thought content normal.        Judgment: Judgment normal.     BP 108/70   Pulse 78   Resp 16   Ht 5' 6 (1.676 m)   Wt 188 lb (85.3 kg)   BMI 30.34 kg/m   Past Medical History:  Diagnosis Date   Anemia    Anxiety    Attention deficit hyperactivity disorder    Complication of anesthesia    Depression    Hashimoto's disease    Headache    hx of migraines   Hypothyroidism    PONV (postoperative nausea and vomiting)    Vertigo 02/2022    Social History   Socioeconomic History   Marital status: Married    Spouse name: Not on file   Number of children: Not on file   Years of education: Not on file   Highest education level: Not on file  Occupational History   Not on file  Tobacco Use  Smoking status: Never    Passive exposure: Never   Smokeless tobacco: Never  Vaping Use   Vaping status: Never Used  Substance and Sexual Activity   Alcohol use: Yes    Alcohol/week: 1.0 standard drink of alcohol    Types: 1 Standard drinks or equivalent per week    Comment: rarely   Drug use: Not Currently    Comment: Gummy  with THC in it   Sexual activity: Yes    Partners: Male    Birth control/protection: Surgical, I.U.D.    Comment: copper IUD in place will get removed  Other Topics Concern   Not on file  Social History Narrative   Not on file   Social Drivers of Health   Financial Resource Strain: Not on file  Food Insecurity: Not on file  Transportation Needs: Not on file  Physical Activity: Not on file  Stress: Not on file  Social Connections: Not on file  Intimate Partner Violence: Not on file    Past Surgical History:  Procedure Laterality Date   CESAREAN SECTION     x2   COLONOSCOPY     ROBOTIC ASSISTED SALPINGO OOPHERECTOMY N/A 06/23/2022   Procedure: XI  ROBOTIC ASSISTED RIGHT SALPINGO OOPHORECTOMY, LEFT SALPINGOECTOMY;  Surgeon: Eldonna Mays, MD;  Location: [facility];  Service: Gynecology;  Laterality: N/A;    Family History  Problem Relation Age of Onset   Mental illness Mother    Depression Mother    Anxiety disorder Mother    Other Mother        hx of ankle surgery   Thyroid  disease Mother    Parkinson's disease Mother    Angina Father    Heart disease Father        bypass h/o   Healthy Son    Epilepsy Son    ADD / ADHD Son    Thyroid  disease Half-Sister    Obesity Half-Sister    Bipolar disorder Half-Sister    Breast cancer Neg Hx    Ovarian cancer Neg Hx    Colon cancer Neg Hx    Endometrial cancer Neg Hx     No Known Allergies     Latest Ref Rng & Units 02/25/2024   10:53 AM 02/07/2024   11:52 AM 07/30/2023   11:41 AM  CBC  WBC 3.8 - 10.8 Thousand/uL 5.0  4.7  5.2   Hemoglobin 11.7 - 15.5 g/dL 85.9  86.9  85.9   Hematocrit 35.0 - 45.0 % 42.3  39.5  42.1   Platelets 140 - 400 Thousand/uL 247  230.0  225       CMP     Component Value Date/Time   NA 139 02/25/2024 1053   K 4.4 02/25/2024 1053   CL 103 02/25/2024 1053   CO2 30 02/25/2024 1053   GLUCOSE 67 02/25/2024 1053   BUN 17 02/25/2024 1053   CREATININE 0.97 02/25/2024 1053   CALCIUM 9.6 02/25/2024 1053   PROT 7.1 02/25/2024 1053   ALBUMIN 4.1 02/07/2024 1152   AST 21 02/25/2024 1053   ALT 39 (H) 02/25/2024 1053   ALKPHOS 65 02/07/2024 1152   BILITOT 0.6 02/25/2024 1053   GFR 65.24 02/07/2024 1152   EGFR 71 07/30/2023 1141   GFRNONAA >60 06/22/2022 1357     No results found.     Assessment & Plan:   1. Lymphedema (Primary) Recommend:  I have had a long discussion with the patient regarding swelling and why it  causes symptoms.  Patient will begin wearing graduated compression on a daily basis a prescription was given. The patient will  wear the stockings first thing in the morning and removing them in the evening. The patient is  instructed specifically not to sleep in the stockings.   In addition, behavioral modification will be initiated.  This will include frequent elevation, use of over the counter pain medications and exercise such as walking.  Consideration for a lymph pump will also be made based upon the effectiveness of conservative therapy.  This would help to improve the edema control and prevent sequela such as ulcers and infections   Patient should undergo duplex ultrasound of the venous system to ensure that DVT or reflux is not present.  The patient will follow-up with me PRN if needed. Ultrasounds to be completed at that time if appropriate.   2. Hypothyroidism, unspecified type Continue hypothyroid medications as already ordered, these medications have been reviewed and there are no changes at this time.   Current Outpatient Medications on File Prior to Visit  Medication Sig Dispense Refill   buPROPion (WELLBUTRIN XL) 150 MG 24 hr tablet Take 1 tablet by mouth daily.     estradiol (VIVELLE-DOT) 0.05 MG/24HR patch Place onto the skin.     Fexofenadine HCl (ALLEGRA PO) Take by mouth daily.     fluticasone (FLONASE) 50 MCG/ACT nasal spray Place into both nostrils daily.     IRON GLYCINATE PO Take by mouth daily.     methylphenidate (RITALIN) 10 MG tablet Take 10 mg by mouth daily.     methylphenidate 27 MG PO CR tablet Take 27 mg by mouth daily.     Multiple Vitamin (MULTI-VITAMIN) tablet Take 1 tablet by mouth daily.     progesterone (PROMETRIUM) 100 MG capsule Take by mouth.     SYNTHROID  125 MCG tablet Take one po every day for six days off day 7     hydrochlorothiazide  (MICROZIDE ) 12.5 MG capsule Take one po every day prn edema (Patient not taking: Reported on 03/17/2024) 30 capsule 1   No current facility-administered medications on file prior to visit.    There are no Patient Instructions on file for this visit. No follow-ups on file.   Laura JONELLE Shank, NP

## 2024-03-20 ENCOUNTER — Other Ambulatory Visit

## 2024-03-20 ENCOUNTER — Other Ambulatory Visit (INDEPENDENT_AMBULATORY_CARE_PROVIDER_SITE_OTHER)

## 2024-03-20 DIAGNOSIS — E063 Autoimmune thyroiditis: Secondary | ICD-10-CM | POA: Diagnosis not present

## 2024-03-20 LAB — TSH: TSH: 1.18 u[IU]/mL (ref 0.35–5.50)

## 2024-03-21 ENCOUNTER — Ambulatory Visit: Payer: Self-pay | Admitting: Family

## 2024-03-24 DIAGNOSIS — F4320 Adjustment disorder, unspecified: Secondary | ICD-10-CM | POA: Diagnosis not present

## 2024-03-31 DIAGNOSIS — Z713 Dietary counseling and surveillance: Secondary | ICD-10-CM | POA: Diagnosis not present

## 2024-04-06 DIAGNOSIS — Z713 Dietary counseling and surveillance: Secondary | ICD-10-CM | POA: Diagnosis not present

## 2024-04-12 DIAGNOSIS — Z713 Dietary counseling and surveillance: Secondary | ICD-10-CM | POA: Diagnosis not present

## 2024-04-14 DIAGNOSIS — F4320 Adjustment disorder, unspecified: Secondary | ICD-10-CM | POA: Diagnosis not present

## 2024-04-28 DIAGNOSIS — F331 Major depressive disorder, recurrent, moderate: Secondary | ICD-10-CM | POA: Diagnosis not present

## 2024-04-28 DIAGNOSIS — F902 Attention-deficit hyperactivity disorder, combined type: Secondary | ICD-10-CM | POA: Diagnosis not present

## 2024-04-28 DIAGNOSIS — F411 Generalized anxiety disorder: Secondary | ICD-10-CM | POA: Diagnosis not present

## 2024-04-28 DIAGNOSIS — F4320 Adjustment disorder, unspecified: Secondary | ICD-10-CM | POA: Diagnosis not present

## 2024-04-28 DIAGNOSIS — F4322 Adjustment disorder with anxiety: Secondary | ICD-10-CM | POA: Diagnosis not present

## 2024-05-04 ENCOUNTER — Encounter: Admitting: Family

## 2024-05-05 DIAGNOSIS — F4320 Adjustment disorder, unspecified: Secondary | ICD-10-CM | POA: Diagnosis not present

## 2024-05-09 DIAGNOSIS — Z713 Dietary counseling and surveillance: Secondary | ICD-10-CM | POA: Diagnosis not present

## 2024-05-14 ENCOUNTER — Encounter: Payer: Self-pay | Admitting: Family

## 2024-05-14 DIAGNOSIS — S99921A Unspecified injury of right foot, initial encounter: Secondary | ICD-10-CM | POA: Diagnosis not present

## 2024-05-14 DIAGNOSIS — M79674 Pain in right toe(s): Secondary | ICD-10-CM | POA: Diagnosis not present

## 2024-05-15 NOTE — Telephone Encounter (Signed)
 Since ongoing since October have her come in.  [place] assess further.   I do not have assess to those reports, if she can get a paper copy of the xray results that'd be helpful but no worries if she does not.

## 2024-05-16 DIAGNOSIS — F4320 Adjustment disorder, unspecified: Secondary | ICD-10-CM | POA: Diagnosis not present

## 2024-05-16 DIAGNOSIS — Z713 Dietary counseling and surveillance: Secondary | ICD-10-CM | POA: Diagnosis not present

## 2024-05-23 DIAGNOSIS — F4320 Adjustment disorder, unspecified: Secondary | ICD-10-CM | POA: Diagnosis not present

## 2024-05-30 DIAGNOSIS — F4320 Adjustment disorder, unspecified: Secondary | ICD-10-CM | POA: Diagnosis not present

## 2024-06-02 ENCOUNTER — Ambulatory Visit (INDEPENDENT_AMBULATORY_CARE_PROVIDER_SITE_OTHER): Admitting: Family

## 2024-06-02 ENCOUNTER — Encounter: Payer: Self-pay | Admitting: Family

## 2024-06-02 VITALS — BP 132/84 | HR 76 | Temp 97.7°F | Ht 66.0 in | Wt 192.4 lb

## 2024-06-02 DIAGNOSIS — M79674 Pain in right toe(s): Secondary | ICD-10-CM

## 2024-06-02 DIAGNOSIS — Z713 Dietary counseling and surveillance: Secondary | ICD-10-CM | POA: Diagnosis not present

## 2024-06-02 NOTE — Progress Notes (Signed)
 "  Established Patient Office Visit  Subjective:      CC:  Chief Complaint  Patient presents with   Acute Visit    Toe pain    HPI: Laura Spencer is a 53 y.o. female presenting on 06/02/2024 for Acute Visit (Toe pain) .  Discussed the use of AI scribe software for clinical note transcription with the patient, who gave verbal consent to proceed.  History of Present Illness Laura Spencer is a 53 year old female who presents with pain in the tip of her fourth toe.  She has been experiencing pain in the tip of her fourth toe since mid-October, localized at the very tipmost joint, which is slightly swollen. She does not recall any specific injury. An x-ray performed at urgent care showed no acute fracture or dislocation; the patient recalls being told there was moderate osteoarthritis and soft tissue swelling in the fourth toe.  She uses Voltaren gel, an NSAID cream, for swelling and pain management. She avoids oral NSAIDs and Tylenol  due to mildly elevated liver enzymes and kidney concerns, having taken Tylenol  only once or twice since being advised to avoid it.  She has a history of osteoarthritis affecting multiple joints and engages in body work once a month, which involves gentle strength exercises and massage techniques, providing relief for about a week.  She is under care for autoimmune issues, including Hashimoto's thyroiditis. Recent lab results showed positive double-stranded DNA and urine protein creatinine ratio, with normal sedimentation rate and complement levels.  She is active in dance, which may exacerbate her toe pain. She uses Danskin pads for the ball of her foot but notes they do not help the toe pain. No pain radiating to the base of the toe.         Social history:  Relevant past medical, surgical, family and social history reviewed and updated as indicated. Interim medical history since our last visit reviewed.  Allergies and medications  reviewed and updated.  DATA REVIEWED: CHART IN EPIC     ROS: Negative unless specifically indicated above in HPI.   Current Medications[1]        Objective:        BP 132/84 (BP Location: Left Arm, Patient Position: Sitting, Cuff Size: Normal)   Pulse 76   Temp 97.7 F (36.5 C) (Temporal)   Ht 5' 6 (1.676 m)   Wt 192 lb 6.4 oz (87.3 kg)   SpO2 98%   BMI 31.05 kg/m   Physical Exam MUSCULOSKELETAL: Tenderness, moderate swelling, and lower positioning of the fourth toe compared to others.  Wt Readings from Last 3 Encounters:  06/02/24 192 lb 6.4 oz (87.3 kg)  03/17/24 188 lb (85.3 kg)  03/13/24 188 lb 12.8 oz (85.6 kg)    Physical Exam Constitutional:      General: She is not in acute distress.    Appearance: Normal appearance. She is normal weight. She is not ill-appearing, toxic-appearing or diaphoretic.  HENT:     Head: Normocephalic.  Cardiovascular:     Rate and Rhythm: Normal rate and regular rhythm.  Pulmonary:     Effort: Pulmonary effort is normal.     Breath sounds: Normal breath sounds.  Musculoskeletal:        General: Normal range of motion.  Feet:     Comments: Right fourth toe at distal phalynx with point tenderness  Neurological:     General: No focal deficit present.     Mental Status: She is alert  and oriented to person, place, and time. Mental status is at baseline.  Psychiatric:        Mood and Affect: Mood normal.        Behavior: Behavior normal.        Thought Content: Thought content normal.        Judgment: Judgment normal.          Results Labs Double stranded DNA antibody (02/2024): Positive Urine protein/creatinine ratio (02/2024): Positive ESR (02/2024): Within normal limits Complement (02/2024): Within normal limits  Radiology Right fourth toe X-ray (05/14/2024): No acute fracture or dislocation. Moderate osteoarthritis. Moderate soft tissue swelling. No acute osseous abnormalities.  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 X-ray shows moderate osteoarthritis and soft tissue swelling without acute fracture or dislocation. Pain localized at the tip of the toe, possibly exacerbated by anatomical positioning and pressure during activities like dancing. Limited use of NSAIDs due to liver concerns. - Tape the fourth toe to an adjacent toe for support for a few weeks. - Continue using Voltaren gel as needed for pain relief. - Consider orthopedic inserts for shoes to provide stability and support. - Avoid dancing for a couple of weeks to reduce pressure on the toe. - Apply ice and heat to the affected area as needed. - If symptoms persist, consider referral to podiatry for further evaluation.  Autoimmune hypothyroidism (Hashimoto's thyroiditis) Hashimoto's thyroiditis with positive double-stranded DNA and urine protein creatinine ratio. Sed rate and complement levels are normal. Monitoring for potential autoimmune flares and kidney stress due to elevated liver enzymes. Regular follow-up with rheumatologist Dr. Dolphus every six months to monitor condition and adjust management as needed. - Continue regular follow-up with rheumatologist Dr. Dolphus every six months. - Avoid incense to reduce kidney stress. - Will recheck urine protein creatinine ratio with next labs.        Return if symptoms worsen or fail to improve.     Ginger Patrick, MSN, APRN, FNP-C [facility]        [1]  Current Outpatient Medications:    buPROPion (WELLBUTRIN XL) 150 MG 24 hr tablet, Take 1 tablet by mouth daily., Disp: , Rfl:    estradiol (VIVELLE-DOT) 0.05 MG/24HR patch, Place onto the skin., Disp: , Rfl:    Fexofenadine HCl (ALLEGRA PO), Take by mouth daily., Disp: , Rfl:    fluticasone (FLONASE) 50 MCG/ACT  nasal spray, Place into both nostrils daily., Disp: , Rfl:    IRON GLYCINATE PO, Take by mouth daily., Disp: , Rfl:    methylphenidate (RITALIN) 10 MG tablet, Take 10 mg by mouth daily., Disp: , Rfl:    methylphenidate 27 MG PO CR tablet, Take 27 mg by mouth daily., Disp: , Rfl:    Multiple Vitamin (MULTI-VITAMIN) tablet, Take 1 tablet by mouth daily., Disp: , Rfl:    progesterone (PROMETRIUM) 100 MG capsule, Take by mouth., Disp: , Rfl:    SYNTHROID  125 MCG tablet, Take one po every day for six days off day 7, Disp: , Rfl:    hydrochlorothiazide  (MICROZIDE ) 12.5 MG capsule, Take one po every day prn edema (Patient not taking: Reported on 06/02/2024), Disp: 30 capsule, Rfl: 1  "

## 2024-09-22 ENCOUNTER — Ambulatory Visit: Admitting: Rheumatology
